# Patient Record
Sex: Male | Born: 1950 | Race: White | Hispanic: No | Smoking: Current every day smoker
Health system: Southern US, Community
[De-identification: ages and names within clinical notes are randomized; demographics above are authoritative.]

## PROBLEM LIST (undated history)

## (undated) DIAGNOSIS — H269 Unspecified cataract: Secondary | ICD-10-CM

## (undated) DIAGNOSIS — I10 Essential (primary) hypertension: Secondary | ICD-10-CM

## (undated) DIAGNOSIS — M1990 Unspecified osteoarthritis, unspecified site: Secondary | ICD-10-CM

## (undated) HISTORY — DX: Essential (primary) hypertension: I10

## (undated) HISTORY — DX: Unspecified osteoarthritis, unspecified site: M19.90

## (undated) HISTORY — DX: Unspecified cataract: H26.9

---

## 2017-10-30 ENCOUNTER — Ambulatory Visit (INDEPENDENT_AMBULATORY_CARE_PROVIDER_SITE_OTHER): Payer: PPO

## 2017-10-30 ENCOUNTER — Ambulatory Visit (INDEPENDENT_AMBULATORY_CARE_PROVIDER_SITE_OTHER): Payer: PPO | Admitting: Orthopaedic Surgery

## 2017-10-30 ENCOUNTER — Encounter (INDEPENDENT_AMBULATORY_CARE_PROVIDER_SITE_OTHER): Payer: Self-pay | Admitting: Orthopaedic Surgery

## 2017-10-30 VITALS — BP 176/102 | HR 79 | Ht 73.0 in | Wt 220.0 lb

## 2017-10-30 DIAGNOSIS — M25561 Pain in right knee: Secondary | ICD-10-CM | POA: Diagnosis not present

## 2017-10-30 MED ORDER — BUPIVACAINE HCL 0.25 % IJ SOLN
4.0000 mL | INTRAMUSCULAR | Status: AC | PRN
  Administered 2017-10-30: 4 mL via INTRA_ARTICULAR

## 2017-10-30 MED ORDER — LIDOCAINE HCL 1 % IJ SOLN
0.5000 mL | INTRAMUSCULAR | Status: AC | PRN
  Administered 2017-10-30: .5 mL

## 2017-10-30 MED ORDER — METHYLPREDNISOLONE ACETATE 40 MG/ML IJ SUSP
40.0000 mg | INTRAMUSCULAR | Status: AC | PRN
  Administered 2017-10-30: 40 mg via INTRA_ARTICULAR

## 2017-10-30 NOTE — Progress Notes (Signed)
Office Visit Note   Patient: Tony Stewart           Date of Birth: 11/12/1950           MRN: [id number] Visit Date: 10/30/2017              Requested by: No referring provider defined for this encounter. PCP: System, Pcp Not In   Assessment & Plan: Visit Diagnoses:  1. Acute pain of right knee     Plan: Knee aspirated yellow fluid without blood slightly cloudy.  We will send it for cell count crystal analysis and cultures.  Recheck 2 weeks.  Intra-articular cortisone injection performed since he has been symptomatic for a month.  Follow-Up Instructions: No Follow-up on file.   Orders:  Orders Placed This Encounter  Procedures  . Large Joint Inj: R knee  . XR KNEE 3 VIEW RIGHT   No orders of the defined types were placed in this encounter.     Procedures: Large Joint Inj: R knee on 10/30/2017 10:20 AM Indications: pain and joint swelling Details: 22 G 1.5 in needle, anterolateral approach  Arthrogram: No  Medications: 40 mg methylPREDNISolone acetate 40 MG/ML; 0.5 mL lidocaine 1 %; 4 mL bupivacaine 0.25 % Outcome: tolerated well, no immediate complications Procedure, treatment alternatives, risks and benefits explained, specific risks discussed. Consent was given by the patient. Immediately prior to procedure a time out was called to verify the correct patient, procedure, equipment, support staff and site/side marked as required. Patient was prepped and draped in the usual sterile fashion.       Clinical Data: No additional findings.   Subjective: Chief Complaint  Patient presents with  . Right Knee - Pain    HPI 67 year old male veteran who has had pain and swelling in his right knee since January.  He had done some hunting and been on his knee does not remember any acute injury.  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  He is occasionally had some flares since that time usually in the spring.  He is tried some  Naprosyn and also some ice with his knee without relief he has had persistent swelling in his knee.  He denies  fevers not noticed any groin pain.  No associated back pain.  He states a week or 2 ago he did have chills for 1 day when he was not feeling real good but this resolved and no problems in the last week.   Review of Systems patient's had no previous surgery.  He is a widower.  He does smoke and drink.  [facility]  Positive for cataracts hypertension and smoking.   Objective: Vital Signs: BP (!) 176/102   Pulse 79   Ht 6\' 1"  (1.854 m)   Wt 220 lb (99.8 kg)   BMI 29.03 kg/m   Physical Exam  Constitutional: He is oriented to person, place, and time. He appears well-developed and well-nourished.  HENT:  Head: Normocephalic and atraumatic.  Eyes: EOM are normal. Pupils are equal, round, and reactive to light.  Neck: No tracheal deviation present. No thyromegaly present.  Cardiovascular: Normal rate.  Pulmonary/Chest: Effort normal. He has no wheezes.  Abdominal: Soft. Bowel sounds are normal.  Neurological: He is alert and oriented to person, place, and time.  Skin: Skin is warm and dry. Capillary refill takes less than 2 seconds.  Psychiatric: He has a normal  mood and affect. His behavior is normal. Judgment and thought content normal.    Ortho Exam patient has intact pedal pulses dorsalis pedis posterior tibial 2+ bilateral.  No lower extremity edema.  There is 3+ knee effusion noted.  Mild crepitus with range of motion.  Some pain with patellar loading.  Has limitation of internal and external rotation right hip of 20 degrees.  Cannot figure 4 on the right.  30 degrees internal rotation and external rotation opposite left hip.  Right hip range of motion does not reproduce his knee pain.  Anterior tib posterior tib is palpable.  He ambulates with a slight knee flexed gait with knee limp on the right.  No sciatic notch tenderness negative  popliteal compression test.  Specialty Comments:  No specialty comments available.  Imaging: Xr Knee 3 View Right  Result Date: 10/30/2017 Three-view x-rays right knee obtained including standing bilateral knees and bilateral sunrise patella.  This shows no acute changes.  Mild lateral spurring and mild joint narrowing.  Increased fluid noted on the right knee. Impression: Mild knee joint space narrowing with small osteophytes.  No acute changes.  There is some calcification in the right femoral artery    PMFS History: There are no active problems to display for this patient.  Past Medical History:  Diagnosis Date  . Arthritis   . Cataracts, bilateral   . Hypertension     Family History  Problem Relation Age of Onset  . Cancer Mother     History reviewed. No pertinent surgical history. Social History   Occupational History  . Not on file  Tobacco Use  . Smoking status: Current Every Day Smoker  . Smokeless tobacco: Never Used  Substance and Sexual Activity  . Alcohol use: Yes    Frequency: Never  . Drug use: No  . Sexual activity: Not on file

## 2017-10-30 NOTE — Addendum Note (Signed)
Addended by: Rogers SeedsYEATTS, Jaquae Rieves M on: 10/30/2017 10:59 AM   Modules accepted: Orders

## 2017-10-31 LAB — SYNOVIAL CELL COUNT + DIFF, W/ CRYSTALS
BASOPHILS, %: 0 %
Eosinophils-Synovial: 0 % (ref 0–2)
LYMPHOCYTES-SYNOVIAL FLD: 13 % (ref 0–74)
Monocyte/Macrophage: 2 % (ref 0–69)
NEUTROPHIL, SYNOVIAL: 85 % — AB (ref 0–24)
Synoviocytes, %: 0 % (ref 0–15)
WBC, SYNOVIAL: 7670 {cells}/uL — AB (ref <150)

## 2017-10-31 LAB — TIQ-NTM

## 2017-11-01 ENCOUNTER — Telehealth (INDEPENDENT_AMBULATORY_CARE_PROVIDER_SITE_OTHER): Payer: Self-pay | Admitting: Orthopaedic Surgery

## 2017-11-01 NOTE — Telephone Encounter (Signed)
Patient is trying to return your call.

## 2017-11-01 NOTE — Telephone Encounter (Signed)
Patients girlfriend called and said they received a phone call from the office, she thinks its concerning his lab work. Please advise # [phone]

## 2017-11-01 NOTE — Telephone Encounter (Signed)
I called discussed.  

## 2017-11-05 LAB — ANAEROBIC AND AEROBIC CULTURE
AER RESULT:: NO GROWTH
MICRO NUMBER: 90214094
MICRO NUMBER:: 90214093
SPECIMEN QUALITY: ADEQUATE
SPECIMEN QUALITY:: ADEQUATE

## 2017-11-14 ENCOUNTER — Encounter (INDEPENDENT_AMBULATORY_CARE_PROVIDER_SITE_OTHER): Payer: Self-pay | Admitting: Orthopaedic Surgery

## 2017-11-14 ENCOUNTER — Ambulatory Visit (INDEPENDENT_AMBULATORY_CARE_PROVIDER_SITE_OTHER): Payer: PPO | Admitting: Orthopaedic Surgery

## 2017-11-14 VITALS — BP 163/96 | HR 68

## 2017-11-14 DIAGNOSIS — M7542 Impingement syndrome of left shoulder: Secondary | ICD-10-CM | POA: Diagnosis not present

## 2017-11-14 DIAGNOSIS — M659 Synovitis and tenosynovitis, unspecified: Secondary | ICD-10-CM

## 2017-11-14 NOTE — Progress Notes (Signed)
Office Visit Note   Patient: Tony Stewart           Date of Birth: 07/20/1951           MRN: [id number] Visit Date: 11/14/2017              Requested by: No referring provider defined for this encounter. PCP: System, Pcp Not In   Assessment & Plan: Visit Diagnoses:  1. Impingement syndrome of left shoulder   2. Synovitis of right knee     Plan:  Right Knee is significantly better.  He develops any locking catching or recurrent swelling he will let us know we can consider MRI scan of his knee.  His left shoulder shows some mild impingement not significant enough to consider injection or therapy referral.  He will call us if he gets worse.  Follow-Up Instructions: Return if symptoms worsen or fail to improve.   Orders:  No orders of the defined types were placed in this encounter.  No orders of the defined types were placed in this encounter.     Procedures: No procedures performed   Clinical Data: No additional findings.   Subjective: Chief Complaint  Patient presents with  . Right Knee - Follow-up, Pain    HPI 67 year old male returns after right  knee aspiration and injection on 10/30/2017.  Fluid analysis was consistent with some synovitis no crystals were seen.  Cultures were negative.  He still has a little bit of posterior medial tenderness but is ambulating much better and has not had any recurrence of swelling.  He has had some problems with his left shoulder with outstretched reaching and overhead activity.  Review of Systems updated and unchanged from last office visit.  Chills or fever no recurrence of swelling.  He has had new problems with left shoulder discomfort.   Objective: Vital Signs: BP (!) 163/96   Pulse 68   Physical Exam  Constitutional: He is oriented to person, place, and time. He appears well-developed and well-nourished.  HENT:  Head: Normocephalic and atraumatic.  Eyes: EOM are normal. Pupils are equal, round, and reactive to light.    Neck: No tracheal deviation present. No thyromegaly present.  Cardiovascular: Normal rate.  Pulmonary/Chest: Effort normal. He has no wheezes.  Abdominal: Soft. Bowel sounds are normal.  Neurological: He is alert and oriented to person, place, and time.  Skin: Skin is warm and dry. Capillary refill takes less than 2 seconds.  Psychiatric: He has a normal mood and affect. His behavior is normal. Judgment and thought content normal.    Ortho Exam mild right knee posterior medial tenderness.  Negative pain with hyperextension ACL PCL exam is normal collateral ligament.  Left shoulder shows some discomfort with impingement he takes good supraspinatus strength.  No instability of the shoulder.  Internal and external rotation is strong lying long head of the biceps shows minimal tenderness.  Specialty Comments:  No specialty comments available.  Imaging: No results found.   PMFS History: Patient Active Problem List   Diagnosis Date Noted  . Impingement syndrome of left shoulder 11/14/2017  . Synovitis of right knee 11/14/2017   Past Medical History:  Diagnosis Date  . Arthritis   . Cataracts, bilateral   . Hypertension     Family History  Problem Relation Age of Onset  . Cancer Mother     No past surgical history on file. Social History   Occupational History  . Not on file  Tobacco Use  .  Smoking status: Current Every Day Smoker  . Smokeless tobacco: Never Used  Substance and Sexual Activity  . Alcohol use: Yes    Frequency: Never  . Drug use: No  . Sexual activity: Not on file

## 2018-03-29 DIAGNOSIS — Z6830 Body mass index (BMI) 30.0-30.9, adult: Secondary | ICD-10-CM | POA: Diagnosis not present

## 2018-03-29 DIAGNOSIS — M353 Polymyalgia rheumatica: Secondary | ICD-10-CM | POA: Diagnosis not present

## 2018-03-29 DIAGNOSIS — I10 Essential (primary) hypertension: Secondary | ICD-10-CM | POA: Diagnosis not present

## 2018-03-29 DIAGNOSIS — E559 Vitamin D deficiency, unspecified: Secondary | ICD-10-CM | POA: Diagnosis not present

## 2018-04-26 DIAGNOSIS — Z0000 Encounter for general adult medical examination without abnormal findings: Secondary | ICD-10-CM | POA: Diagnosis not present

## 2018-04-26 DIAGNOSIS — E782 Mixed hyperlipidemia: Secondary | ICD-10-CM | POA: Diagnosis not present

## 2018-04-26 DIAGNOSIS — I10 Essential (primary) hypertension: Secondary | ICD-10-CM | POA: Diagnosis not present

## 2018-04-26 DIAGNOSIS — M353 Polymyalgia rheumatica: Secondary | ICD-10-CM | POA: Diagnosis not present

## 2018-04-26 DIAGNOSIS — E559 Vitamin D deficiency, unspecified: Secondary | ICD-10-CM | POA: Diagnosis not present

## 2018-04-26 DIAGNOSIS — Z6830 Body mass index (BMI) 30.0-30.9, adult: Secondary | ICD-10-CM | POA: Diagnosis not present

## 2019-04-12 DIAGNOSIS — H2513 Age-related nuclear cataract, bilateral: Secondary | ICD-10-CM | POA: Diagnosis not present

## 2019-04-12 DIAGNOSIS — H47012 Ischemic optic neuropathy, left eye: Secondary | ICD-10-CM | POA: Diagnosis not present

## 2019-04-12 DIAGNOSIS — H35013 Changes in retinal vascular appearance, bilateral: Secondary | ICD-10-CM | POA: Diagnosis not present

## 2019-04-12 DIAGNOSIS — H4323 Crystalline deposits in vitreous body, bilateral: Secondary | ICD-10-CM | POA: Diagnosis not present

## 2019-04-15 ENCOUNTER — Other Ambulatory Visit: Payer: Self-pay

## 2019-04-18 DIAGNOSIS — H35013 Changes in retinal vascular appearance, bilateral: Secondary | ICD-10-CM | POA: Diagnosis not present

## 2019-04-18 DIAGNOSIS — H47012 Ischemic optic neuropathy, left eye: Secondary | ICD-10-CM | POA: Diagnosis not present

## 2019-04-25 DIAGNOSIS — M353 Polymyalgia rheumatica: Secondary | ICD-10-CM | POA: Diagnosis not present

## 2019-04-25 DIAGNOSIS — E782 Mixed hyperlipidemia: Secondary | ICD-10-CM | POA: Diagnosis not present

## 2019-04-25 DIAGNOSIS — I10 Essential (primary) hypertension: Secondary | ICD-10-CM | POA: Diagnosis not present

## 2019-04-25 DIAGNOSIS — Z6830 Body mass index (BMI) 30.0-30.9, adult: Secondary | ICD-10-CM | POA: Diagnosis not present

## 2019-04-25 DIAGNOSIS — E559 Vitamin D deficiency, unspecified: Secondary | ICD-10-CM | POA: Diagnosis not present

## 2019-04-25 DIAGNOSIS — H47012 Ischemic optic neuropathy, left eye: Secondary | ICD-10-CM | POA: Diagnosis not present

## 2019-04-25 DIAGNOSIS — Z0000 Encounter for general adult medical examination without abnormal findings: Secondary | ICD-10-CM | POA: Diagnosis not present

## 2019-05-01 DIAGNOSIS — Z0001 Encounter for general adult medical examination with abnormal findings: Secondary | ICD-10-CM | POA: Diagnosis not present

## 2019-05-01 DIAGNOSIS — E782 Mixed hyperlipidemia: Secondary | ICD-10-CM | POA: Diagnosis not present

## 2019-05-01 DIAGNOSIS — M353 Polymyalgia rheumatica: Secondary | ICD-10-CM | POA: Diagnosis not present

## 2019-05-01 DIAGNOSIS — E559 Vitamin D deficiency, unspecified: Secondary | ICD-10-CM | POA: Diagnosis not present

## 2019-05-01 DIAGNOSIS — I10 Essential (primary) hypertension: Secondary | ICD-10-CM | POA: Diagnosis not present

## 2019-05-06 DIAGNOSIS — Z0000 Encounter for general adult medical examination without abnormal findings: Secondary | ICD-10-CM | POA: Diagnosis not present

## 2019-06-18 DIAGNOSIS — H35013 Changes in retinal vascular appearance, bilateral: Secondary | ICD-10-CM | POA: Diagnosis not present

## 2019-06-18 DIAGNOSIS — H47012 Ischemic optic neuropathy, left eye: Secondary | ICD-10-CM | POA: Diagnosis not present

## 2019-08-07 ENCOUNTER — Other Ambulatory Visit: Payer: Self-pay

## 2019-11-20 DIAGNOSIS — M353 Polymyalgia rheumatica: Secondary | ICD-10-CM | POA: Diagnosis not present

## 2019-11-20 DIAGNOSIS — Z0000 Encounter for general adult medical examination without abnormal findings: Secondary | ICD-10-CM | POA: Diagnosis not present

## 2019-11-20 DIAGNOSIS — I10 Essential (primary) hypertension: Secondary | ICD-10-CM | POA: Diagnosis not present

## 2019-11-20 DIAGNOSIS — E559 Vitamin D deficiency, unspecified: Secondary | ICD-10-CM | POA: Diagnosis not present

## 2019-11-20 DIAGNOSIS — Z6830 Body mass index (BMI) 30.0-30.9, adult: Secondary | ICD-10-CM | POA: Diagnosis not present

## 2019-11-20 DIAGNOSIS — E782 Mixed hyperlipidemia: Secondary | ICD-10-CM | POA: Diagnosis not present

## 2019-11-20 DIAGNOSIS — R7301 Impaired fasting glucose: Secondary | ICD-10-CM | POA: Diagnosis not present

## 2019-12-02 DIAGNOSIS — I10 Essential (primary) hypertension: Secondary | ICD-10-CM | POA: Diagnosis not present

## 2019-12-02 DIAGNOSIS — E559 Vitamin D deficiency, unspecified: Secondary | ICD-10-CM | POA: Diagnosis not present

## 2019-12-02 DIAGNOSIS — F17210 Nicotine dependence, cigarettes, uncomplicated: Secondary | ICD-10-CM | POA: Diagnosis not present

## 2019-12-02 DIAGNOSIS — Z0000 Encounter for general adult medical examination without abnormal findings: Secondary | ICD-10-CM | POA: Diagnosis not present

## 2019-12-02 DIAGNOSIS — E782 Mixed hyperlipidemia: Secondary | ICD-10-CM | POA: Diagnosis not present

## 2019-12-02 DIAGNOSIS — M353 Polymyalgia rheumatica: Secondary | ICD-10-CM | POA: Diagnosis not present

## 2020-01-02 ENCOUNTER — Ambulatory Visit (INDEPENDENT_AMBULATORY_CARE_PROVIDER_SITE_OTHER): Payer: PPO | Admitting: Orthopaedic Surgery

## 2020-01-02 ENCOUNTER — Ambulatory Visit (INDEPENDENT_AMBULATORY_CARE_PROVIDER_SITE_OTHER): Payer: PPO

## 2020-01-02 ENCOUNTER — Encounter: Payer: Self-pay | Admitting: Orthopaedic Surgery

## 2020-01-02 ENCOUNTER — Other Ambulatory Visit: Payer: Self-pay

## 2020-01-02 VITALS — BP 168/101 | HR 71 | Ht 73.0 in | Wt 230.0 lb

## 2020-01-02 DIAGNOSIS — M79671 Pain in right foot: Secondary | ICD-10-CM | POA: Diagnosis not present

## 2020-01-02 NOTE — Progress Notes (Signed)
Office Visit Note   Patient: Tony Stewart           Date of Birth: 06/05/1951           MRN: [id number] Visit Date: 01/02/2020              Requested by: No referring provider defined for this encounter. PCP: System, Pcp Not In   Assessment & Plan: Visit Diagnoses:  1. Pain in right foot     Plan: 10 blade scalpel used for callus debridement.  We discussed foot care filing down the hard callus.  He understands it will take some time to get it fully debrided but considerable amount of thick callus was resected with the scalpel blade.  Afterwards he could walk states he had minimal discomfort.  Follow-Up Instructions: No follow-ups on file.   Orders:  Orders Placed This Encounter  Procedures  . XR Foot Complete Right   No orders of the defined types were placed in this encounter.     Procedures: No procedures performed   Clinical Data: No additional findings.   Subjective: Chief Complaint  Patient presents with  . Right Foot - Pain    HPI 69 year old male seen with right lateral foot pain.  He has had some filing done plantar surface of the fifth metatarsal with persistent pain no drainage.  He likes to [place] hunt had been doing a lot of walking because is limited to male [place] and states now is having problems walking.  Denies fever chills no past history of foot fractures.  Review of Systems 14 point review of systems noncontributory.  Previously arthroscopy doing well.   Objective: Vital Signs: BP (!) 168/101   Pulse 71   Ht 6\' 1"  (1.854 m)   Wt 230 lb (104.3 kg)   BMI 30.34 kg/m   Physical Exam Constitutional:      Appearance: He is well-developed.  HENT:     Head: Normocephalic and atraumatic.  Eyes:     Pupils: Pupils are equal, round, and reactive to light.  Neck:     Thyroid: No thyromegaly.     Trachea: No tracheal deviation.  Cardiovascular:     Rate and Rhythm: Normal rate.  Pulmonary:     Effort: Pulmonary effort is normal.   Breath sounds: No wheezing.  Abdominal:     General: Bowel sounds are normal.     Palpations: Abdomen is soft.  Skin:    General: Skin is warm and dry.     Capillary Refill: Capillary refill takes less than 2 seconds.  Neurological:     Mental Status: He is alert and oriented to person, place, and time.  Psychiatric:        Behavior: Behavior normal.        Thought Content: Thought content normal.        Judgment: Judgment normal.     Ortho Exam patient has hard thickened callus over the fifth metatarsal head tender.  No underlying blister.  Midfoot subtalar ankle range of motion is normal.  Specialty Comments:  No specialty comments available.  Imaging: No results found.   PMFS History: Patient Active Problem List   Diagnosis Date Noted  . Impingement syndrome of left shoulder 11/14/2017  . Synovitis of right knee 11/14/2017   Past Medical History:  Diagnosis Date  . Arthritis   . Cataracts, bilateral   . Hypertension     Family History  Problem Relation Age of Onset  . Cancer Mother  No past surgical history on file. Social History   Occupational History  . Not on file  Tobacco Use  . Smoking status: Current Every Day Smoker  . Smokeless tobacco: Never Used  Substance and Sexual Activity  . Alcohol use: Yes  . Drug use: No  . Sexual activity: Not on file

## 2020-06-24 DIAGNOSIS — Z0000 Encounter for general adult medical examination without abnormal findings: Secondary | ICD-10-CM | POA: Diagnosis not present

## 2020-06-24 DIAGNOSIS — E559 Vitamin D deficiency, unspecified: Secondary | ICD-10-CM | POA: Diagnosis not present

## 2020-06-24 DIAGNOSIS — E782 Mixed hyperlipidemia: Secondary | ICD-10-CM | POA: Diagnosis not present

## 2020-06-24 DIAGNOSIS — Z6830 Body mass index (BMI) 30.0-30.9, adult: Secondary | ICD-10-CM | POA: Diagnosis not present

## 2020-06-24 DIAGNOSIS — M353 Polymyalgia rheumatica: Secondary | ICD-10-CM | POA: Diagnosis not present

## 2020-06-24 DIAGNOSIS — I10 Essential (primary) hypertension: Secondary | ICD-10-CM | POA: Diagnosis not present

## 2020-07-08 DIAGNOSIS — D751 Secondary polycythemia: Secondary | ICD-10-CM | POA: Diagnosis not present

## 2020-07-08 DIAGNOSIS — R7301 Impaired fasting glucose: Secondary | ICD-10-CM | POA: Diagnosis not present

## 2020-07-08 DIAGNOSIS — E782 Mixed hyperlipidemia: Secondary | ICD-10-CM | POA: Diagnosis not present

## 2020-07-08 DIAGNOSIS — E559 Vitamin D deficiency, unspecified: Secondary | ICD-10-CM | POA: Diagnosis not present

## 2020-07-08 DIAGNOSIS — M353 Polymyalgia rheumatica: Secondary | ICD-10-CM | POA: Diagnosis not present

## 2020-07-08 DIAGNOSIS — I10 Essential (primary) hypertension: Secondary | ICD-10-CM | POA: Diagnosis not present

## 2020-07-08 DIAGNOSIS — F17210 Nicotine dependence, cigarettes, uncomplicated: Secondary | ICD-10-CM | POA: Diagnosis not present

## 2021-01-06 DIAGNOSIS — I10 Essential (primary) hypertension: Secondary | ICD-10-CM | POA: Diagnosis not present

## 2021-03-03 DIAGNOSIS — E559 Vitamin D deficiency, unspecified: Secondary | ICD-10-CM | POA: Diagnosis not present

## 2021-03-03 DIAGNOSIS — Z125 Encounter for screening for malignant neoplasm of prostate: Secondary | ICD-10-CM | POA: Diagnosis not present

## 2021-03-03 DIAGNOSIS — R7301 Impaired fasting glucose: Secondary | ICD-10-CM | POA: Diagnosis not present

## 2021-03-03 DIAGNOSIS — E782 Mixed hyperlipidemia: Secondary | ICD-10-CM | POA: Diagnosis not present

## 2021-03-03 DIAGNOSIS — I10 Essential (primary) hypertension: Secondary | ICD-10-CM | POA: Diagnosis not present

## 2021-03-10 DIAGNOSIS — I10 Essential (primary) hypertension: Secondary | ICD-10-CM | POA: Diagnosis not present

## 2021-03-10 DIAGNOSIS — E559 Vitamin D deficiency, unspecified: Secondary | ICD-10-CM | POA: Diagnosis not present

## 2021-03-10 DIAGNOSIS — R7301 Impaired fasting glucose: Secondary | ICD-10-CM | POA: Diagnosis not present

## 2021-03-10 DIAGNOSIS — E782 Mixed hyperlipidemia: Secondary | ICD-10-CM | POA: Diagnosis not present

## 2021-09-16 DIAGNOSIS — U071 COVID-19: Secondary | ICD-10-CM | POA: Diagnosis not present

## 2021-12-04 ENCOUNTER — Emergency Department (HOSPITAL_COMMUNITY): Payer: PPO

## 2021-12-04 ENCOUNTER — Other Ambulatory Visit: Payer: Self-pay

## 2021-12-04 ENCOUNTER — Emergency Department (HOSPITAL_COMMUNITY)
Admission: EM | Admit: 2021-12-04 | Discharge: 2021-12-04 | Disposition: A | Discharge status: Home / Self Care | Payer: PPO | Attending: Emergency Medicine | Admitting: Emergency Medicine

## 2021-12-04 ENCOUNTER — Encounter (HOSPITAL_COMMUNITY): Payer: Self-pay

## 2021-12-04 DIAGNOSIS — R270 Ataxia, unspecified: Secondary | ICD-10-CM

## 2021-12-04 DIAGNOSIS — R42 Dizziness and giddiness: Secondary | ICD-10-CM | POA: Insufficient documentation

## 2021-12-04 DIAGNOSIS — I10 Essential (primary) hypertension: Secondary | ICD-10-CM | POA: Diagnosis not present

## 2021-12-04 DIAGNOSIS — R2689 Other abnormalities of gait and mobility: Secondary | ICD-10-CM | POA: Insufficient documentation

## 2021-12-04 DIAGNOSIS — R202 Paresthesia of skin: Secondary | ICD-10-CM | POA: Diagnosis not present

## 2021-12-04 DIAGNOSIS — R079 Chest pain, unspecified: Secondary | ICD-10-CM | POA: Diagnosis not present

## 2021-12-04 DIAGNOSIS — I6782 Cerebral ischemia: Secondary | ICD-10-CM | POA: Diagnosis not present

## 2021-12-04 DIAGNOSIS — M47812 Spondylosis without myelopathy or radiculopathy, cervical region: Secondary | ICD-10-CM | POA: Diagnosis not present

## 2021-12-04 DIAGNOSIS — I700 Atherosclerosis of aorta: Secondary | ICD-10-CM | POA: Diagnosis not present

## 2021-12-04 DIAGNOSIS — M79603 Pain in arm, unspecified: Secondary | ICD-10-CM | POA: Diagnosis not present

## 2021-12-04 DIAGNOSIS — M5033 Other cervical disc degeneration, cervicothoracic region: Secondary | ICD-10-CM | POA: Diagnosis not present

## 2021-12-04 LAB — CBC
HCT: 50.8 % (ref 39.0–52.0)
Hemoglobin: 17 g/dL (ref 13.0–17.0)
MCH: 33.6 pg (ref 26.0–34.0)
MCHC: 33.5 g/dL (ref 30.0–36.0)
MCV: 100.4 fL — ABNORMAL HIGH (ref 80.0–100.0)
Platelets: 287 10*3/uL (ref 150–400)
RBC: 5.06 MIL/uL (ref 4.22–5.81)
RDW: 14 % (ref 11.5–15.5)
WBC: 7.5 10*3/uL (ref 4.0–10.5)
nRBC: 0 % (ref 0.0–0.2)

## 2021-12-04 LAB — BASIC METABOLIC PANEL
Anion gap: 8 (ref 5–15)
BUN: 13 mg/dL (ref 8–23)
CO2: 25 mmol/L (ref 22–32)
Calcium: 9.3 mg/dL (ref 8.9–10.3)
Chloride: 106 mmol/L (ref 98–111)
Creatinine, Ser: 1.07 mg/dL (ref 0.61–1.24)
GFR, Estimated: >60 mL/min (ref >60)
Glucose, Bld: 110 mg/dL — ABNORMAL HIGH (ref 70–99)
Potassium: 4.1 mmol/L (ref 3.5–5.1)
Sodium: 139 mmol/L (ref 135–145)

## 2021-12-04 LAB — HEPATIC FUNCTION PANEL
ALT: 20 U/L (ref 0–44)
AST: 22 U/L (ref 15–41)
Albumin: 4.2 g/dL (ref 3.5–5.0)
Alkaline Phosphatase: 42 U/L (ref 38–126)
Bilirubin, Direct: 0.1 mg/dL (ref 0.0–0.2)
Indirect Bilirubin: 0.9 mg/dL (ref 0.3–0.9)
Total Bilirubin: 1 mg/dL (ref 0.3–1.2)
Total Protein: 7.3 g/dL (ref 6.5–8.1)

## 2021-12-04 LAB — TROPONIN I (HIGH SENSITIVITY)
Troponin I (High Sensitivity): 5 ng/L (ref <18)
Troponin I (High Sensitivity): 6 ng/L (ref <18)

## 2021-12-04 NOTE — ED Provider Notes (Signed)
?[facility] ?Provider Note ? ? ?CSN: [id number] ?Arrival date & time: 12/04/21  1119 ? ?  ? ?History ? ?Chief Complaint  ?Patient presents with  ? Chest Pain  ? ? ?Tony Stewart is a 71 y.o. male. ? ?Patient complains of feeling unsteady walking for a number of weeks and also some numbness in his left arm with some unsteadiness.  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 ? ?The history is provided by the patient and medical records.  ?Weakness ?Severity:  Moderate ?Onset quality:  Sudden ?Timing:  Constant ?Progression:  Waxing and waning ?Chronicity:  New ?Context: alcohol use   ?Relieved by:  Nothing ?Worsened by:  Nothing ?Ineffective treatments:  None tried ?Associated symptoms: dizziness   ?Associated symptoms: no abdominal pain, no chest pain, no cough, no diarrhea, no frequency, no headaches and no seizures   ? ?  ? ?Home Medications ?Prior to Admission medications   ?Medication Sig Start Date End Date Taking? Authorizing Provider  ?amLODipine (NORVASC) 10 MG tablet Take 10 mg by mouth daily.    [provider]  ?losartan (COZAAR) 50 MG tablet Take 50 mg by mouth daily. 12/02/19   [provider]  ?   ? ?Allergies    ?Patient has no known allergies.   ? ?Review of Systems   ?Review of Systems  ?Constitutional:  Negative for appetite change and fatigue.  ?HENT:  Negative for congestion, ear discharge and sinus pressure.   ?Eyes:  Negative for discharge.  ?Respiratory:  Negative for cough.   ?Cardiovascular:  Negative for chest pain.  ?Gastrointestinal:  Negative for abdominal pain and diarrhea.  ?Genitourinary:  Negative for frequency and hematuria.  ?Musculoskeletal:  Negative for back pain.  ?Skin:  Negative for rash.  ?Neurological:  Positive for dizziness and weakness. Negative for seizures and headaches.  ?     Dizziness and ataxia and poor coordination in his arms  ?Psychiatric/Behavioral:  Negative for hallucinations.   ? ?Physical  Exam ?Updated Vital Signs ?BP 133/89   Pulse 78   Temp 98.2 ?F (36.8 ?C)   Resp 18   Ht 6\' 1"  (1.854 m)   Wt 104.3 kg   SpO2 95%   BMI 30.34 kg/m?  ?Physical Exam ?Vitals and nursing note reviewed.  ?Constitutional:   ?   Appearance: He is well-developed.  ?HENT:  ?   Head: Normocephalic.  ?   Nose: Nose normal.  ?Eyes:  ?   General: No scleral icterus. ?   Conjunctiva/sclera: Conjunctivae normal.  ?Neck:  ?   Thyroid: No thyromegaly.  ?Cardiovascular:  ?   Rate and Rhythm: Normal rate and regular rhythm.  ?   Heart sounds: No murmur heard. ?  No friction rub. No gallop.  ?Pulmonary:  ?   Breath sounds: No stridor. No wheezing or rales.  ?Chest:  ?   Chest wall: No tenderness.  ?Abdominal:  ?   General: There is no distension.  ?   Tenderness: There is no abdominal tenderness. There is no rebound.  ?Musculoskeletal:     ?   General: Normal range of motion.  ?   Cervical back: Neck supple.  ?   Comments: Patient with minor ataxia and minor coordination problems with his arm  ?Lymphadenopathy:  ?   Cervical: No cervical adenopathy.  ?Skin: ?   Findings: No erythema or rash.  ?Neurological:  ?   Mental Status: He is alert and oriented to person, place,  and time.  ?   Motor: No abnormal muscle tone.  ?   Coordination: Coordination normal.  ?Psychiatric:     ?   Behavior: Behavior normal.  ? ? ?ED Results / Procedures / Treatments   ?Labs ?(all labs ordered are listed, but only abnormal results are displayed) ?Labs Reviewed  ?BASIC METABOLIC PANEL - Abnormal; Notable for the following components:  ?    Result Value  ? Glucose, Bld 110 (*)   ? All other components within normal limits  ?CBC - Abnormal; Notable for the following components:  ? MCV 100.4 (*)   ? All other components within normal limits  ?HEPATIC FUNCTION PANEL  ?TROPONIN I (HIGH SENSITIVITY)  ?TROPONIN I (HIGH SENSITIVITY)  ? ? ?EKG ?None ? ?Radiology ?CT Head Wo Contrast ? ?Result Date: 12/04/2021 ?CLINICAL DATA:  71 year old male with history of  persistent recurrent dizziness. EXAM: CT HEAD WITHOUT CONTRAST CT CERVICAL SPINE WITHOUT CONTRAST TECHNIQUE: Multidetector CT imaging of the head and cervical spine was performed following the standard protocol without intravenous contrast. Multiplanar CT image reconstructions of the cervical spine were also generated. RADIATION DOSE REDUCTION: This exam was performed according to the departmental dose-optimization program which includes automated exposure control, adjustment of the mA and/or kV according to patient size and/or use of iterative reconstruction technique. COMPARISON:  No priors. FINDINGS: CT HEAD FINDINGS Brain: Patchy areas of mild decreased attenuation are noted throughout the deep and periventricular white matter of the cerebral hemispheres bilaterally, compatible with mild chronic microvascular ischemic disease. No evidence of acute infarction, hemorrhage, hydrocephalus, extra-axial collection or mass lesion/mass effect. Vascular: No hyperdense vessel or unexpected calcification. Skull: Normal. Negative for fracture or focal lesion. Sinuses/Orbits: No acute finding. Other: None. CT CERVICAL SPINE FINDINGS Alignment: Normal. Skull base and vertebrae: No acute fracture. No primary bone lesion or focal pathologic process. Soft tissues and spinal canal: No prevertebral fluid or swelling. No visible canal hematoma. Disc levels: Minimal multilevel degenerative disc disease, most evident at C6-C7 and C7-T1. Mild multilevel facet arthropathy. Upper chest: Negative. Other: None. IMPRESSION: 1. No acute intracranial abnormalities. 2. No acute abnormality of the cervical spine. 3. Mild chronic microvascular ischemic changes in the cerebral white matter, as above. 4. Mild multilevel degenerative disc disease and cervical spondylosis, as above. Electronically Signed   By: Vinnie Langton M.D.   On: 12/04/2021 12:44  ? ?CT Cervical Spine Wo Contrast ? ?Result Date: 12/04/2021 ?CLINICAL DATA:  71 year old male  with history of persistent recurrent dizziness. EXAM: CT HEAD WITHOUT CONTRAST CT CERVICAL SPINE WITHOUT CONTRAST TECHNIQUE: Multidetector CT imaging of the head and cervical spine was performed following the standard protocol without intravenous contrast. Multiplanar CT image reconstructions of the cervical spine were also generated. RADIATION DOSE REDUCTION: This exam was performed according to the departmental dose-optimization program which includes automated exposure control, adjustment of the mA and/or kV according to patient size and/or use of iterative reconstruction technique. COMPARISON:  No priors. FINDINGS: CT HEAD FINDINGS Brain: Patchy areas of mild decreased attenuation are noted throughout the deep and periventricular white matter of the cerebral hemispheres bilaterally, compatible with mild chronic microvascular ischemic disease. No evidence of acute infarction, hemorrhage, hydrocephalus, extra-axial collection or mass lesion/mass effect. Vascular: No hyperdense vessel or unexpected calcification. Skull: Normal. Negative for fracture or focal lesion. Sinuses/Orbits: No acute finding. Other: None. CT CERVICAL SPINE FINDINGS Alignment: Normal. Skull base and vertebrae: No acute fracture. No primary bone lesion or focal pathologic process. Soft tissues and spinal canal: No prevertebral  fluid or swelling. No visible canal hematoma. Disc levels: Minimal multilevel degenerative disc disease, most evident at C6-C7 and C7-T1. Mild multilevel facet arthropathy. Upper chest: Negative. Other: None. IMPRESSION: 1. No acute intracranial abnormalities. 2. No acute abnormality of the cervical spine. 3. Mild chronic microvascular ischemic changes in the cerebral white matter, as above. 4. Mild multilevel degenerative disc disease and cervical spondylosis, as above. Electronically Signed   By: Vinnie Langton M.D.   On: 12/04/2021 12:44  ? ?DG Chest Portable 1 View ? ?Result Date: 12/04/2021 ?CLINICAL DATA:   71 year old male with history of chest pain for the past 2 weeks. EXAM: PORTABLE CHEST 1 VIEW COMPARISON:  No priors. FINDINGS: Lung volumes are normal. No consolidative airspace disease. No pleural effusions. No pneumot

## 2021-12-04 NOTE — ED Notes (Signed)
Patient refusing admission and wanting to be discharged and reports he will follow up.  Patient advised of risk of leaving such as heart attack stroke, death.  Verbalized understanding.  ?

## 2021-12-04 NOTE — ED Notes (Addendum)
Patient also reports being dizzy. States his coordination is off and left side is having numbness.  Reports he feels as though he is going to fall when he walks.  ?

## 2021-12-04 NOTE — ED Notes (Signed)
Patients significant other reports patient drinks 6-12 cans of beer a day along with 1-2 packs a day of cigarettes.  ?

## 2021-12-04 NOTE — Discharge Instructions (Signed)
Drink plenty of fluids and be careful ambulating.  You can get a cane to help with walking.  Follow-up with your family doctor this week and also follow-up with Dr. Gerilyn Pilgrim in 1 to 2 weeks. ?

## 2021-12-04 NOTE — ED Triage Notes (Signed)
Pt bib ems for cp x 2 weeks intermittently.  Has been feeling "Off."  Reports lack of coordination x 2 weeks.  Reports covid in Jan.  Hx of htn.  Non compliant with home medication.  1 nitro and 1 asa given by ems.  Rates pain at present 6/10.  20ga in [place].   ?

## 2021-12-06 ENCOUNTER — Encounter (HOSPITAL_COMMUNITY): Payer: Self-pay

## 2021-12-06 DIAGNOSIS — G459 Transient cerebral ischemic attack, unspecified: Secondary | ICD-10-CM | POA: Diagnosis not present

## 2021-12-06 DIAGNOSIS — E669 Obesity, unspecified: Secondary | ICD-10-CM | POA: Diagnosis not present

## 2021-12-06 DIAGNOSIS — I10 Essential (primary) hypertension: Secondary | ICD-10-CM | POA: Diagnosis not present

## 2021-12-06 DIAGNOSIS — F17200 Nicotine dependence, unspecified, uncomplicated: Secondary | ICD-10-CM | POA: Diagnosis not present

## 2021-12-06 DIAGNOSIS — Z6830 Body mass index (BMI) 30.0-30.9, adult: Secondary | ICD-10-CM | POA: Diagnosis not present

## 2021-12-14 DIAGNOSIS — G459 Transient cerebral ischemic attack, unspecified: Secondary | ICD-10-CM | POA: Diagnosis not present

## 2021-12-14 DIAGNOSIS — I10 Essential (primary) hypertension: Secondary | ICD-10-CM | POA: Diagnosis not present

## 2021-12-14 DIAGNOSIS — F17200 Nicotine dependence, unspecified, uncomplicated: Secondary | ICD-10-CM | POA: Diagnosis not present

## 2021-12-31 ENCOUNTER — Ambulatory Visit (INDEPENDENT_AMBULATORY_CARE_PROVIDER_SITE_OTHER): Payer: PPO

## 2021-12-31 ENCOUNTER — Ambulatory Visit: Payer: PPO | Admitting: Podiatry

## 2021-12-31 DIAGNOSIS — L84 Corns and callosities: Secondary | ICD-10-CM

## 2021-12-31 DIAGNOSIS — M21611 Bunion of right foot: Secondary | ICD-10-CM | POA: Diagnosis not present

## 2021-12-31 DIAGNOSIS — M21619 Bunion of unspecified foot: Secondary | ICD-10-CM

## 2021-12-31 DIAGNOSIS — M7751 Other enthesopathy of right foot: Secondary | ICD-10-CM | POA: Diagnosis not present

## 2021-12-31 MED ORDER — TRIAMCINOLONE ACETONIDE 10 MG/ML IJ SUSP
10.0000 mg | Freq: Once | INTRAMUSCULAR | Status: AC
  Administered 2021-12-31: 10 mg

## 2022-01-03 NOTE — Progress Notes (Signed)
Subjective:  ? ?Patient ID: Tony Stewart, male   DOB: 71 y.o.   MRN: [id number]  ? ?HPI ?Patient presents with significant pain inflammation of the plantar right foot with lesion formation x2 with fluid around the fifth metatarsal head lesion around the third metatarsal.  States its been sore making it hard to walk on and its been an ongoing problem for a while and patient does not smoke likes to be active ? ? ?Review of Systems  ?All other systems reviewed and are negative. ? ? ?   ?Objective:  ?Physical Exam ?Vitals and nursing note reviewed.  ?Constitutional:   ?   Appearance: He is well-developed.  ?Pulmonary:  ?   Effort: Pulmonary effort is normal.  ?Musculoskeletal:     ?   General: Normal range of motion.  ?Skin: ?   General: Skin is warm.  ?Neurological:  ?   Mental Status: He is alert.  ?  ?Neurovascular status found to be intact muscle strength found to be adequate range of motion is within normal limits with inflammation fluid around the fifth MPJ right pain with pressure keratotic lesions of third fifth metatarsal right with pain.  Good digital perfusion well oriented x3 ? ?   ?Assessment:  ?Inflammatory capsulitis of the fifth MPJ right with fluid buildup around the joint surface with lesion formation subthird metatarsal ? ?   ?Plan:  ?H&P x-rays reviewed condition discussed and I went ahead today did sterile prep and injected around the fifth MPJ plantar capsule 3 mg dexamethasone Kenalog 5 mg Xylocaine debrided the lesions of third metatarsal advised on supportive shoe with good cushion and possibility at 1 point in future for metatarsal head resection depending on response to conservative treatment and made him aware of that today ? ?X-rays indicated some enlargement of the fifth metatarsal head negative for other pathology mild arthritis noted ?   ? ? ?

## 2022-02-11 DIAGNOSIS — E782 Mixed hyperlipidemia: Secondary | ICD-10-CM | POA: Diagnosis not present

## 2022-02-11 DIAGNOSIS — R7301 Impaired fasting glucose: Secondary | ICD-10-CM | POA: Diagnosis not present

## 2022-02-22 DIAGNOSIS — F17200 Nicotine dependence, unspecified, uncomplicated: Secondary | ICD-10-CM | POA: Diagnosis not present

## 2022-02-22 DIAGNOSIS — Z716 Tobacco abuse counseling: Secondary | ICD-10-CM | POA: Diagnosis not present

## 2022-02-22 DIAGNOSIS — I10 Essential (primary) hypertension: Secondary | ICD-10-CM | POA: Diagnosis not present

## 2022-02-22 DIAGNOSIS — R5383 Other fatigue: Secondary | ICD-10-CM | POA: Diagnosis not present

## 2022-02-22 DIAGNOSIS — E559 Vitamin D deficiency, unspecified: Secondary | ICD-10-CM | POA: Diagnosis not present

## 2022-06-14 DIAGNOSIS — M353 Polymyalgia rheumatica: Secondary | ICD-10-CM | POA: Diagnosis not present

## 2022-06-14 DIAGNOSIS — I10 Essential (primary) hypertension: Secondary | ICD-10-CM | POA: Diagnosis not present

## 2022-08-17 DIAGNOSIS — E559 Vitamin D deficiency, unspecified: Secondary | ICD-10-CM | POA: Diagnosis not present

## 2022-08-17 DIAGNOSIS — I10 Essential (primary) hypertension: Secondary | ICD-10-CM | POA: Diagnosis not present

## 2022-08-24 DIAGNOSIS — F17200 Nicotine dependence, unspecified, uncomplicated: Secondary | ICD-10-CM | POA: Diagnosis not present

## 2022-08-24 DIAGNOSIS — E875 Hyperkalemia: Secondary | ICD-10-CM | POA: Diagnosis not present

## 2022-08-24 DIAGNOSIS — R7301 Impaired fasting glucose: Secondary | ICD-10-CM | POA: Diagnosis not present

## 2022-08-24 DIAGNOSIS — E559 Vitamin D deficiency, unspecified: Secondary | ICD-10-CM | POA: Diagnosis not present

## 2022-08-24 DIAGNOSIS — D72829 Elevated white blood cell count, unspecified: Secondary | ICD-10-CM | POA: Diagnosis not present

## 2022-08-24 DIAGNOSIS — Z716 Tobacco abuse counseling: Secondary | ICD-10-CM | POA: Diagnosis not present

## 2022-08-24 DIAGNOSIS — E782 Mixed hyperlipidemia: Secondary | ICD-10-CM | POA: Diagnosis not present

## 2022-08-24 DIAGNOSIS — I10 Essential (primary) hypertension: Secondary | ICD-10-CM | POA: Diagnosis not present

## 2022-08-24 DIAGNOSIS — R5383 Other fatigue: Secondary | ICD-10-CM | POA: Diagnosis not present

## 2022-08-24 DIAGNOSIS — M353 Polymyalgia rheumatica: Secondary | ICD-10-CM | POA: Diagnosis not present

## 2022-08-24 DIAGNOSIS — Z0001 Encounter for general adult medical examination with abnormal findings: Secondary | ICD-10-CM | POA: Diagnosis not present

## 2022-10-19 ENCOUNTER — Encounter: Payer: Self-pay | Admitting: Podiatry

## 2022-10-19 ENCOUNTER — Ambulatory Visit (INDEPENDENT_AMBULATORY_CARE_PROVIDER_SITE_OTHER): Payer: PPO

## 2022-10-19 ENCOUNTER — Ambulatory Visit (INDEPENDENT_AMBULATORY_CARE_PROVIDER_SITE_OTHER): Payer: PPO | Admitting: Podiatry

## 2022-10-19 DIAGNOSIS — M7751 Other enthesopathy of right foot: Secondary | ICD-10-CM

## 2022-10-19 DIAGNOSIS — M21611 Bunion of right foot: Secondary | ICD-10-CM | POA: Diagnosis not present

## 2022-10-19 DIAGNOSIS — M21619 Bunion of unspecified foot: Secondary | ICD-10-CM

## 2022-10-19 DIAGNOSIS — L84 Corns and callosities: Secondary | ICD-10-CM | POA: Diagnosis not present

## 2022-10-19 MED ORDER — TRIAMCINOLONE ACETONIDE 10 MG/ML IJ SUSP
10.0000 mg | Freq: Once | INTRAMUSCULAR | Status: AC
  Administered 2022-10-19: 10 mg

## 2022-10-19 NOTE — Progress Notes (Signed)
Subjective:   Patient ID: Tony Stewart, male   DOB: 72 y.o.   MRN: [id number]   HPI Patient states he is having a lot of pain in the outside of his right foot again and does smoke 2 packs of cigarettes a day and admits that he is trying to reduce but he did have COVID and is having a lot of lung issues currently   ROS      Objective:  Physical Exam  Neurovascular status intact with inflammation of the fifth MPJ right fluid buildup and also has a lesion underneath the right foot around the fourth metatarsal that is painful when pressed.  Concerned about his overall health condition with significant shortness of breath     Assessment:  Inflammatory capsulitis fifth MPJ right lesion subfourth metatarsal right and not doing well with COVID     Plan:  H&P reviewed and at 1 point this may require surgery but not till I know he has been evaluated by pulmonologist wife strongly encouraged DC and also to reduce smoking.  I did do a sterile prep injected the fifth MPJ 3 mg Dexasone Kenalog 5 mg Xylocaine debrided lesion fully underneath the fourth metatarsal and will be seen back to recheck X-ray indicates the lesion is directly on the fifth metatarsal right and there is a secondary lesion fourth metatarsal right

## 2022-10-25 ENCOUNTER — Telehealth: Payer: Self-pay | Admitting: Pulmonary Disease

## 2022-10-25 ENCOUNTER — Ambulatory Visit (INDEPENDENT_AMBULATORY_CARE_PROVIDER_SITE_OTHER): Payer: PPO | Admitting: Pulmonary Disease

## 2022-10-25 ENCOUNTER — Ambulatory Visit (INDEPENDENT_AMBULATORY_CARE_PROVIDER_SITE_OTHER): Payer: PPO

## 2022-10-25 ENCOUNTER — Encounter: Payer: Self-pay | Admitting: Pulmonary Disease

## 2022-10-25 VITALS — BP 138/82 | HR 69 | Ht 73.0 in | Wt 228.6 lb

## 2022-10-25 DIAGNOSIS — R002 Palpitations: Secondary | ICD-10-CM

## 2022-10-25 DIAGNOSIS — R0600 Dyspnea, unspecified: Secondary | ICD-10-CM | POA: Diagnosis not present

## 2022-10-25 DIAGNOSIS — F17210 Nicotine dependence, cigarettes, uncomplicated: Secondary | ICD-10-CM

## 2022-10-25 DIAGNOSIS — J439 Emphysema, unspecified: Secondary | ICD-10-CM | POA: Diagnosis not present

## 2022-10-25 LAB — CBC WITH DIFFERENTIAL/PLATELET
Basophils Absolute: 0.1 10*3/uL (ref 0.0–0.1)
Basophils Relative: 0.6 % (ref 0.0–3.0)
Eosinophils Absolute: 0.2 10*3/uL (ref 0.0–0.7)
Eosinophils Relative: 2 % (ref 0.0–5.0)
HCT: 48.1 % (ref 39.0–52.0)
Hemoglobin: 16.2 g/dL (ref 13.0–17.0)
Lymphocytes Relative: 14.9 % (ref 12.0–46.0)
Lymphs Abs: 1.8 10*3/uL (ref 0.7–4.0)
MCHC: 33.7 g/dL (ref 30.0–36.0)
MCV: 98.2 fl (ref 78.0–100.0)
Monocytes Absolute: 1.1 10*3/uL — ABNORMAL HIGH (ref 0.1–1.0)
Monocytes Relative: 8.9 % (ref 3.0–12.0)
Neutro Abs: 8.8 10*3/uL — ABNORMAL HIGH (ref 1.4–7.7)
Neutrophils Relative %: 73.6 % (ref 43.0–77.0)
Platelets: 365 10*3/uL (ref 150.0–400.0)
RBC: 4.9 Mil/uL (ref 4.22–5.81)
RDW: 12.9 % (ref 11.5–15.5)
WBC: 12 10*3/uL — ABNORMAL HIGH (ref 4.0–10.5)

## 2022-10-25 LAB — D-DIMER, QUANTITATIVE: D-Dimer, Quant: 0.44 mcg/mL FEU (ref <0.50)

## 2022-10-25 MED ORDER — TIOTROPIUM BROMIDE-OLODATEROL 2.5-2.5 MCG/ACT IN AERS: 2.0000 | INHALATION_SPRAY | Freq: Every day | RESPIRATORY_TRACT | 5 refills | Status: DC

## 2022-10-25 MED ORDER — ALBUTEROL SULFATE HFA 108 (90 BASE) MCG/ACT IN AERS: 2.0000 | INHALATION_SPRAY | Freq: Four times a day (QID) | RESPIRATORY_TRACT | 5 refills | Status: AC | PRN

## 2022-10-25 NOTE — Progress Notes (Signed)
Synopsis: Referred in February 2024 for dyspnea  Subjective:   PATIENT ID: Tony Stewart GENDER: male DOB: [birth year], MRN: [id number]   HPI  Chief Complaint  Patient presents with   Consult    Sob, at rest and during exertion. 6-8 weeks ago    "Tony Stewart" had COVID in 09/2021 and had a "bad case" and then ended up having a stroke 11/2021. He doesn't have any energy since then He feels terrible in the morning when he wakes up Gets very short of breath when he walks to the door. He and his wife caught COVID again in January 2024 and now he feels terribly. Normally he stays pretty active outside, but right now he is having a really hard time breathing when he goes outside HIs main problem is that he feels winded He doesn't cough much, doesn't have chest congestion Symptoms really started after the January 2024 illness.  He says he wasn't as fatigued this go around with COVID, minimal cough.   He says that Thanksgiving and Christmas this year were fine, no breathing difficulty.  In general he had improvement in his dyspnea.   No associated chest pain, no leg swelling.  He feels like his heart is racing every once in a while (since January 2024), sometimes it happens when he's just sitting still.  No audible wheezing.  He has tried his wife's inhaler, this doesn't help.    He's never been told he has asthma or a lung problem.  He's always been active and never had difficulty with breathing.  He has smoked 1ppd since 1970.   He was drafted by the [employer] but then ended up going to [place].  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  No problems with dust, chemicals, dust.     Past Medical History:  Diagnosis Date   Arthritis    Cataracts, bilateral    Hypertension      Family History  Problem Relation Age of Onset   Cancer Mother      Social History   Socioeconomic History   Marital status: Single    Spouse name: Not on file   Number of  children: Not on file   Years of education: Not on file   Highest education level: Not on file  Occupational History   Not on file  Tobacco Use   Smoking status: Every Day    Packs/day: 2.00    Types: Cigarettes   Smokeless tobacco: Never  Substance and Sexual Activity   Alcohol use: Yes    Alcohol/week: 12.0 standard drinks of alcohol    Types: 12 Cans of beer per week    Comment: 6-12 budweisers a day   Drug use: No   Sexual activity: Not on file  Other Topics Concern   Not on file  Social History Narrative   ** Merged History Encounter **       Social Determinants of Health   Financial Resource Strain: Not on file  Food Insecurity: Not on file  Transportation Needs: Not on file  Physical Activity: Not on file  Stress: Not on file  Social Connections: Not on file  Intimate Partner Violence: Not on file     No Known Allergies   Outpatient Medications Prior to Visit  Medication Sig Dispense Refill   amLODipine (NORVASC) 10 MG tablet Take 10 mg by mouth daily.     losartan (COZAAR) 50 MG tablet Take 75 mg by mouth  daily.     No facility-administered medications prior to visit.    Review of Systems  Constitutional:  Positive for malaise/fatigue. Negative for chills, fever and weight loss.  HENT:  Negative for congestion, nosebleeds, sinus pain and sore throat.   Eyes:  Negative for photophobia, pain and discharge.  Respiratory:  Positive for shortness of breath. Negative for hemoptysis, sputum production and wheezing.   Cardiovascular:  Negative for chest pain, palpitations, orthopnea and leg swelling.  Gastrointestinal:  Negative for abdominal pain, constipation, diarrhea, nausea and vomiting.  Genitourinary:  Negative for dysuria, frequency, hematuria and urgency.  Musculoskeletal:  Negative for back pain, joint pain, myalgias and neck pain.  Skin:  Negative for itching and rash.  Neurological:  Negative for tingling, tremors, sensory change, speech change, focal  weakness, seizures, weakness and headaches.  Psychiatric/Behavioral:  Negative for memory loss, substance abuse and suicidal ideas. The patient is not nervous/anxious.       Objective:  Physical Exam   Vitals:   10/25/22 1337  BP: 138/82  Pulse: 69  SpO2: 98%  Weight: 228 lb 9.6 oz (103.7 kg)  Height: 6' 1"$  (1.854 m)    Gen: well appearing, no acute distress HENT: NCAT, OP clear, neck supple without masses Eyes: PERRL, EOMi Lymph: no cervical lymphadenopathy PULM: Wheezing left base B CV: RRR, no mgr, no JVD GI: BS+, soft, nontender, no hsm Derm: no rash or skin breakdown MSK: normal bulk and tone Neuro: A&Ox4, CN II-XII intact, strength 5/5 in all 4 extremities Psyche: normal mood and affect   CBC    Component Value Date/Time   WBC 7.5 12/04/2021 1134   RBC 5.06 12/04/2021 1134   HGB 17.0 12/04/2021 1134   HCT 50.8 12/04/2021 1134   PLT 287 12/04/2021 1134   MCV 100.4 (H) 12/04/2021 1134   MCH 33.6 12/04/2021 1134   MCHC 33.5 12/04/2021 1134   RDW 14.0 12/04/2021 1134     Chest imaging: March 2023 AP film chest x-ray images independently reviewed showing no pulmonary parenchymal infiltrate, normal cardiac silhouette  PFT:  Labs:  Path:  Echo:  Heart Catheterization:       Assessment & Plan:   Dyspnea, unspecified type - Plan: Pulmonary function test, DG Chest 2 View, D-Dimer, Quantitative, CBC w/Diff  Heart palpitations  Cigarette smoker  Discussion: Tony Stewart presents with dyspnea, wheezing after having COVID and has an extensive smoking history.  I question whether or not he has emphysema based on the March 2023 chest x-ray which I have reviewed where there appears to be some hyperinflation.  Based on his smoking history and wheezing I think it is very likely he has COPD.  Obviously, we need to test for this with lung function testing and keep in mind that there are other possibilities in the differential diagnosis, specifically anemia and blood  clots given the recent COVID.  Plan: Shortness of breath with wheezing, smoking history: Start Stiolto 2 puffs daily no matter how you feel Use albuterol as needed for chest tightness wheezing or shortness of breath Take the inhalers as I demonstrated today Practice good hand hygiene Stay physically active Pulmonary function test Ambulatory oximetry testing CBC Chest x-ray  Heart palpitations, recent COVID, shortness of breath: Check D-dimer blood test If this is positive you may need to have a CT angiogram of your chest to look for blood clot  Cigarette smoking: I strongly suggest that she stop smoking right away Call-1-800-quit-NOW get free to get nicotine replacement from the state  of [place] and counseling We will need to discuss lung cancer screening on the next visit  Follow-up 4 to 6 weeks, sooner if needed.  Immunizations: Immunization History  Administered Date(s) Administered   Tdap 05/06/2008     Current Outpatient Medications:    albuterol (VENTOLIN HFA) 108 (90 Base) MCG/ACT inhaler, Inhale 2 puffs into the lungs every 6 (six) hours as needed for wheezing or shortness of breath., Disp: 8 g, Rfl: 5   amLODipine (NORVASC) 10 MG tablet, Take 10 mg by mouth daily., Disp: , Rfl:    losartan (COZAAR) 50 MG tablet, Take 75 mg by mouth daily., Disp: , Rfl:    Tiotropium Bromide-Olodaterol 2.5-2.5 MCG/ACT AERS, Inhale 2 puffs into the lungs daily., Disp: 4 g, Rfl: 5

## 2022-10-25 NOTE — Patient Instructions (Signed)
Shortness of breath with wheezing, smoking history: Start Stiolto 2 puffs daily no matter how you feel Use albuterol as needed for chest tightness wheezing or shortness of breath Take the inhalers as I demonstrated today Practice good hand hygiene Stay physically active Pulmonary function test Ambulatory oximetry testing CBC Chest x-ray  Heart palpitations, recent COVID, shortness of breath: Check D-dimer blood test If this is positive you may need to have a CT angiogram of your chest to look for blood clot  Cigarette smoking: I strongly suggest that she stop smoking right away Call-1-800-quit-NOW get free to get nicotine replacement from the state of [place] and counseling We will need to discuss lung cancer screening on the next visit  Follow-up 4 to 6 weeks, sooner if needed.

## 2022-10-26 MED ORDER — TIOTROPIUM BROMIDE-OLODATEROL 2.5-2.5 MCG/ACT IN AERS: 2.0000 | INHALATION_SPRAY | Freq: Every day | RESPIRATORY_TRACT | 5 refills | Status: DC

## 2022-10-26 NOTE — Addendum Note (Signed)
Addended by: Meredith Staggers A on: 10/26/2022 10:16 AM   Modules accepted: Orders

## 2022-10-26 NOTE — Telephone Encounter (Signed)
Mardene Celeste states Tiotropium Bromide was not called into pharmacy,. Pharmacy is [facility]. Mardene Celeste phone number is [phone].

## 2022-10-26 NOTE — Telephone Encounter (Signed)
Spoke with patients wife she is requesting the results from lab work and chest xray. Dr. Lake Bells please advise?

## 2022-10-27 ENCOUNTER — Telehealth: Payer: Self-pay

## 2022-10-27 NOTE — Telephone Encounter (Signed)
Pt's spouse called for herself but pt asked for chest x-ray results while I was on the phone with spouse. Please advise. Was seen on 10/25/22

## 2022-10-28 NOTE — Progress Notes (Signed)
Spoke with pt and notified of results per [name]. Pt verbalized understanding and denied any questions. 

## 2022-10-28 NOTE — Telephone Encounter (Signed)
Spoke wife in regards to chest xray. She states patient was able to get Stiolto and states this is working well for him. Nothing further needed.

## 2022-11-02 ENCOUNTER — Telehealth: Payer: Self-pay

## 2022-11-02 NOTE — Telephone Encounter (Signed)
Sent pt  mychart message about chest x-ray and Dr Lake Bells interruption. Nothing further at this time.

## 2022-11-07 DIAGNOSIS — H00025 Hordeolum internum left lower eyelid: Secondary | ICD-10-CM | POA: Diagnosis not present

## 2022-11-07 DIAGNOSIS — H47012 Ischemic optic neuropathy, left eye: Secondary | ICD-10-CM | POA: Diagnosis not present

## 2022-11-07 DIAGNOSIS — H524 Presbyopia: Secondary | ICD-10-CM | POA: Diagnosis not present

## 2022-11-07 DIAGNOSIS — H2513 Age-related nuclear cataract, bilateral: Secondary | ICD-10-CM | POA: Diagnosis not present

## 2022-11-07 DIAGNOSIS — H35033 Hypertensive retinopathy, bilateral: Secondary | ICD-10-CM | POA: Diagnosis not present

## 2022-11-22 ENCOUNTER — Ambulatory Visit: Payer: PPO | Admitting: Pulmonary Disease

## 2022-11-22 ENCOUNTER — Ambulatory Visit: Payer: PPO | Admitting: Primary Care

## 2022-12-08 NOTE — Progress Notes (Signed)
Tony Stewart, male    DOB: [birth year]    MRN: [id number]   Brief patient profile:  38  yowm  active smoker  self - referred to pulmonary clinic in [place]  12/09/2022  for copd/ emphysema with pfts still pending      History of Present Illness  12/09/2022  Pulmonary/ 1st office eval/ Tony Stewart / [place] [facility]  Chief Complaint  Patient presents with   Follow-up    New pt appt pt was seen by [name]. He states that his breathing is good fromt 0-10 it is ranked @ 7. His inhaler are helping as well  Dyspnea:  slowed by foot / toting firewood sob and worse with [place] his property [place] acres  Cough: none  Sleep: on side / bed is flat SABA use: stiolto helped, no need for rescue 02: not using  LCS: declined   No obvious day to day or daytime pattern/variability or assoc excess/ purulent sputum or mucus plugs or hemoptysis or cp or chest tightness, subjective wheeze or overt sinus or hb symptoms.   Sleeping  without nocturnal  or early am exacerbation  of respiratory  c/o's or need for noct saba. Also denies any obvious fluctuation of symptoms with weather or environmental changes or other aggravating or alleviating factors except as outlined above   No unusual exposure hx or h/o childhood pna/ asthma or knowledge of premature birth.  Current Allergies, Complete Past Medical History, Past Surgical History, Family History, and Social History were reviewed in [employer] record.  ROS  The following are not active complaints unless bolded Hoarseness, sore throat, dysphagia, dental problems, itching, sneezing,  nasal congestion or discharge of excess mucus or purulent secretions, ear ache,   fever, chills, sweats, unintended wt loss or wt gain, classically pleuritic or exertional cp,  orthopnea pnd or arm/hand swelling  or leg swelling, presyncope, palpitations, abdominal pain, anorexia, nausea, vomiting, diarrhea  or change in bowel habits or change in  bladder habits, change in stools or change in urine, dysuria, hematuria,  rash, arthralgias, visual complaints, headache, numbness, weakness or ataxia or problems with walking or coordination,  change in mood or  memory.             Past Medical History:  Diagnosis Date   Arthritis    Cataracts, bilateral    Hypertension     Outpatient Medications Prior to Visit  Medication Sig Dispense Refill   albuterol (VENTOLIN HFA) 108 (90 Base) MCG/ACT inhaler Inhale 2 puffs into the lungs every 6 (six) hours as needed for wheezing or shortness of breath. 8 g 5   amLODipine (NORVASC) 10 MG tablet Take 10 mg by mouth daily.     losartan (COZAAR) 50 MG tablet Take 75 mg by mouth daily.     Tiotropium Bromide-Olodaterol 2.5-2.5 MCG/ACT AERS Inhale 2 puffs into the lungs daily. 4 g 5   No facility-administered medications prior to visit.     Objective:     BP 129/76   Pulse 80   Ht 6\' 1"  (1.854 m)   Wt 235 lb (106.6 kg)   BMI 31.00 kg/m    Amb wm nad   HEENT :  Oropharynx  clear/ edentulous      NECK :  without JVD/Nodes/TM/ nl carotid upstrokes bilaterally   LUNGS: no acc muscle use,  Mod barrel  contour chest wall with bilateral  Distant bs s audible wheeze and  without cough on insp  or exp maneuvers and mod  Hyperresonant  to  percussion bilaterally     CV:  RRR  no s3 or murmur or increase in P2, and no edema   ABD:  soft and nontender with pos mid insp Hoover's  in the supine position. No bruits or organomegaly appreciated, bowel sounds nl  MS:   Ext warm without deformities or   obvious joint restrictions , calf tenderness, cyanosis or clubbing  SKIN: warm and dry without lesions    NEURO:  alert, approp, nl sensorium with  no motor or cerebellar deficits apparent.             I personally reviewed images and agree with radiology impression as follows:  CXR:   Pa and Lateral  Emphysematous changes in the lungs.   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   Says breathing better since started stiolto c/w Pt is Group B in terms of symptom/risk and laba/lama therefore appropriate rx at this point >>>  stiolto approp but needs pfts for baseline   - The proper method of use, as well as anticipated side effects, of a SMI/respimat inhaler were discussed and demonstrated to the patient using teach back method and an empty device  F/u yearly with pfts in meantime   Cigarette smoker 4-5 min discussion re active cigarette smoking in addition to office E&M  Ask about tobacco use:   ongoing Advise quitting   I took an extended  opportunity with this patient to outline the consequences of continued cigarette use  in airway disorders based on all the data we have from the multiple national lung health studies (perfomed over decades at millions of dollars in cost)  indicating that smoking cessation, not choice of inhalers or physicians, is the most important aspect of his care.   Assess willingness:  Not committed at this point Assist in quit attempt:  Per PCP when ready Arrange follow up:   Follow up per Primary Care planned   Also declined lung cancer screening .  F/u yearly sooner prn   Each maintenance medication was reviewed in detail including emphasizing most importantly the difference between maintenance and prns and under what circumstances the prns are to be triggered using an action plan format where appropriate.  Total time for H and P, chart review, counseling, reviewing smi device(s) , directly observing portions of ambulatory 02 saturation study/ and generating customized AVS unique to this  office visit / same day charting > 40 min                       Christinia Gully, MD 12/09/2022

## 2022-12-09 ENCOUNTER — Encounter: Payer: Self-pay | Admitting: Internal Medicine

## 2022-12-09 ENCOUNTER — Ambulatory Visit (INDEPENDENT_AMBULATORY_CARE_PROVIDER_SITE_OTHER): Payer: PPO | Admitting: Internal Medicine

## 2022-12-09 VITALS — BP 129/76 | HR 80 | Ht 73.0 in | Wt 235.0 lb

## 2022-12-09 DIAGNOSIS — F17210 Nicotine dependence, cigarettes, uncomplicated: Secondary | ICD-10-CM | POA: Diagnosis not present

## 2022-12-09 DIAGNOSIS — J449 Chronic obstructive pulmonary disease, unspecified: Secondary | ICD-10-CM | POA: Diagnosis not present

## 2022-12-09 MED ORDER — TIOTROPIUM BROMIDE-OLODATEROL 2.5-2.5 MCG/ACT IN AERS
2.0000 | INHALATION_SPRAY | Freq: Every day | RESPIRATORY_TRACT | 11 refills | Status: DC
Start: 2022-12-09 — End: 2024-03-14

## 2022-12-09 NOTE — Assessment & Plan Note (Signed)
4-5 min discussion re active cigarette smoking in addition to office E&M  Ask about tobacco use:   ongoing Advise quitting   I took an extended  opportunity with this patient to outline the consequences of continued cigarette use  in airway disorders based on all the data we have from the multiple national lung health studies (perfomed over decades at millions of dollars in cost)  indicating that smoking cessation, not choice of inhalers or physicians, is the most important aspect of his care.   Assess willingness:  Not committed at this point Assist in quit attempt:  Per PCP when ready Arrange follow up:   Follow up per Primary Care planned   Also declined lung cancer screening .  F/u yearly sooner prn   Each maintenance medication was reviewed in detail including emphasizing most importantly the difference between maintenance and prns and under what circumstances the prns are to be triggered using an action plan format where appropriate.  Total time for H and P, chart review, counseling, reviewing smi device(s) , directly observing portions of ambulatory 02 saturation study/ and generating customized AVS unique to this office visit / same day charting > 40 min

## 2022-12-09 NOTE — Patient Instructions (Addendum)
I do recommend a low dose screening CT yearly until age 72 and alpha one AT phenotype  [facility] - discuss with your primary care provider   My office will be contacting you by phone for referral for PFTs   - if you don't hear back from my office within one week please call us back or notify us thru MyChart and we'll address it right away.   Work on inhaler technique:  relax and gently blow all the way out then take a nice smooth full deep breath back in, triggering the inhaler at same time you start breathing in.  Hold breath in for at least  5 seconds if you can.  Rinse and gargle with water when done.       Please schedule a follow up visit in 12  months but call sooner if needed [phone]

## 2022-12-09 NOTE — Assessment & Plan Note (Addendum)
Active smoker - classic findings of mod copd on exam coupled with mod hyperinflation on cxr - rec alpha one AT phenotype > declined - 12/09/2022   Walked on RA  x  3  lap(s) =  approx 450  ft  @ mod fast pace, stopped due to end of study s sob with lowest 02 sats 91% lowest.   Says breathing better since started stiolto c/w Pt is Group B in terms of symptom/risk and laba/lama therefore appropriate rx at this point >>>  stiolto approp but needs pfts for baseline   - The proper method of use, as well as anticipated side effects, of a SMI/respimat inhaler were discussed and demonstrated to the patient using teach back method and an empty device  F/u yearly with pfts in meantime

## 2023-03-28 DIAGNOSIS — E559 Vitamin D deficiency, unspecified: Secondary | ICD-10-CM | POA: Diagnosis not present

## 2023-03-28 DIAGNOSIS — I10 Essential (primary) hypertension: Secondary | ICD-10-CM | POA: Diagnosis not present

## 2023-04-04 DIAGNOSIS — E782 Mixed hyperlipidemia: Secondary | ICD-10-CM | POA: Diagnosis not present

## 2023-04-04 DIAGNOSIS — E669 Obesity, unspecified: Secondary | ICD-10-CM | POA: Diagnosis not present

## 2023-04-04 DIAGNOSIS — F17210 Nicotine dependence, cigarettes, uncomplicated: Secondary | ICD-10-CM | POA: Diagnosis not present

## 2023-04-04 DIAGNOSIS — F1090 Alcohol use, unspecified, uncomplicated: Secondary | ICD-10-CM | POA: Diagnosis not present

## 2023-04-04 DIAGNOSIS — J449 Chronic obstructive pulmonary disease, unspecified: Secondary | ICD-10-CM | POA: Diagnosis not present

## 2023-04-04 DIAGNOSIS — E875 Hyperkalemia: Secondary | ICD-10-CM | POA: Diagnosis not present

## 2023-04-04 DIAGNOSIS — Z0001 Encounter for general adult medical examination with abnormal findings: Secondary | ICD-10-CM | POA: Diagnosis not present

## 2023-04-04 DIAGNOSIS — E559 Vitamin D deficiency, unspecified: Secondary | ICD-10-CM | POA: Diagnosis not present

## 2023-04-04 DIAGNOSIS — I10 Essential (primary) hypertension: Secondary | ICD-10-CM | POA: Diagnosis not present

## 2023-04-04 DIAGNOSIS — R7301 Impaired fasting glucose: Secondary | ICD-10-CM | POA: Diagnosis not present

## 2023-04-04 DIAGNOSIS — I700 Atherosclerosis of aorta: Secondary | ICD-10-CM | POA: Diagnosis not present

## 2023-07-11 IMAGING — CT CT HEAD W/O CM
4 series · 15 of 47 positions shown, 17 images · non-contrast
Comparison: No priors.

CLINICAL DATA: 70-year-old male with history of persistent
recurrent dizziness.



[Series 2: head w o · axial · 0.47mm/px · z∈[-27,+103]mm · 7 of 36 slices shown, 9 images]
[im 5/36  brain]
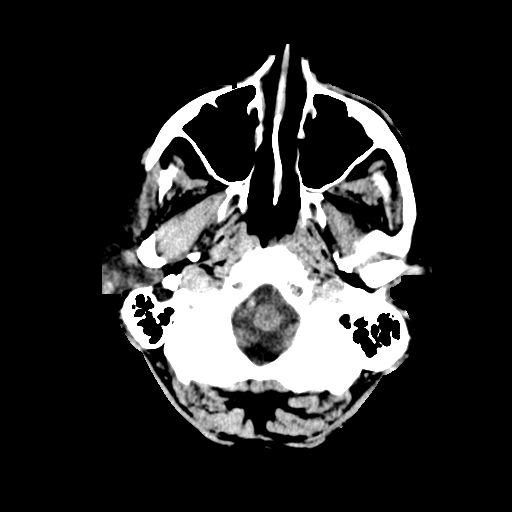
[im 5/36  bone]
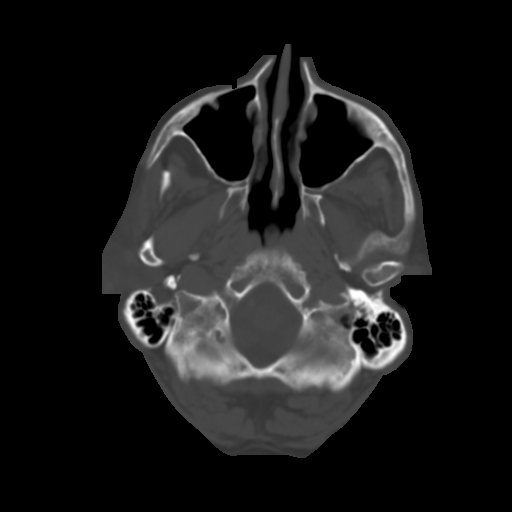
[im 9/36  brain]
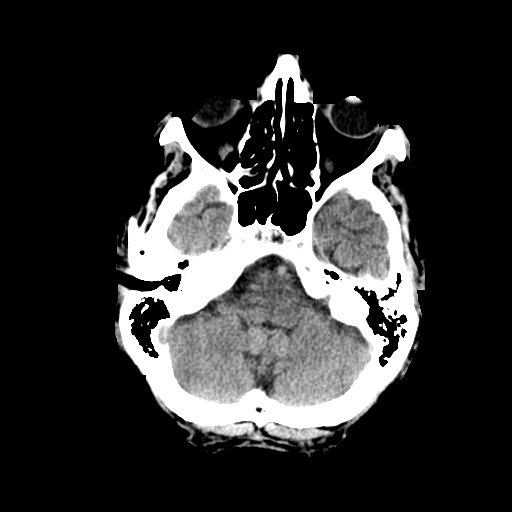
[im 14/36  brain]
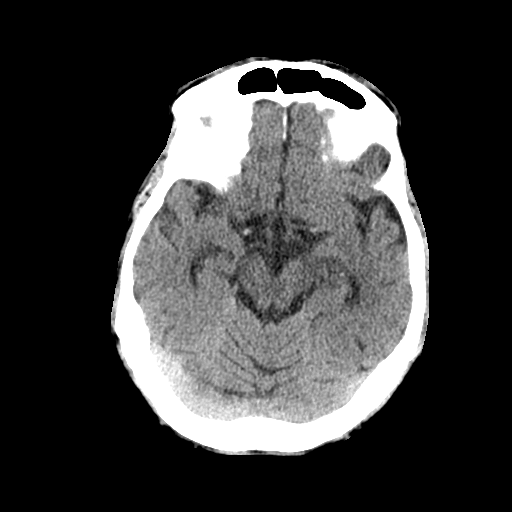
[im 18/36  brain]
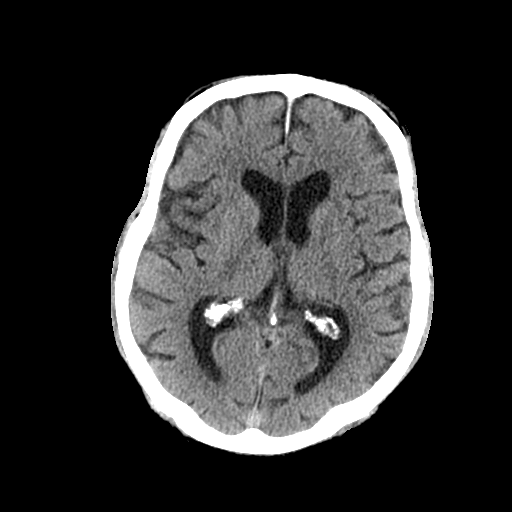
[im 22/36  brain]
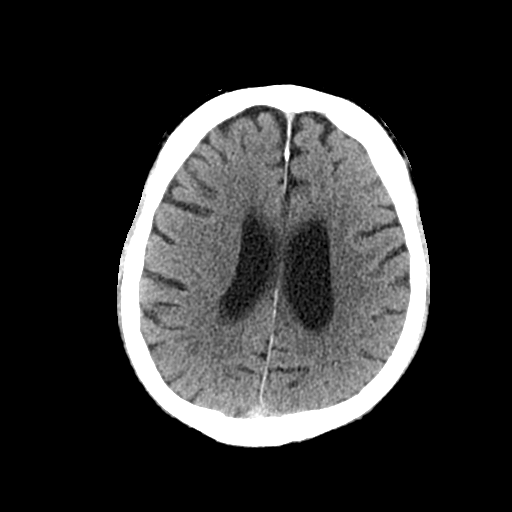
[im 22/36  bone]
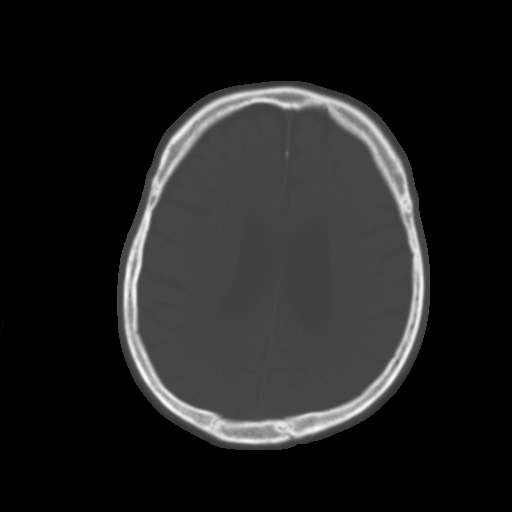
[im 27/36  brain]
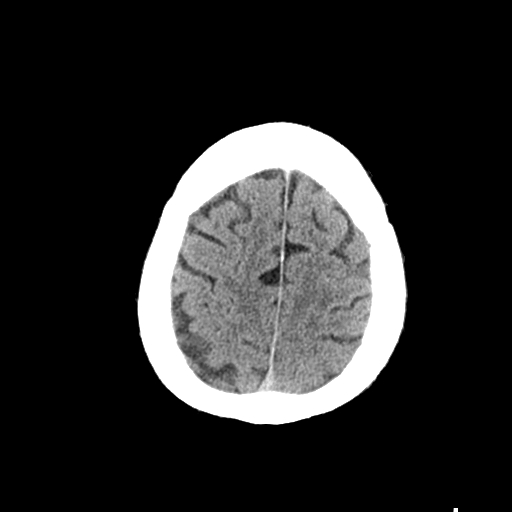
[im 31/36  brain]
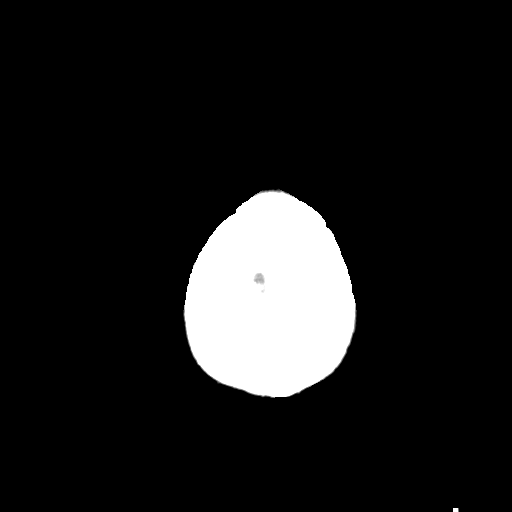

[Series 3: head bone · axial · 0.47mm/px · z∈[-31,-13]mm · 2 of 89 slices shown]
[im 9/89  bone]
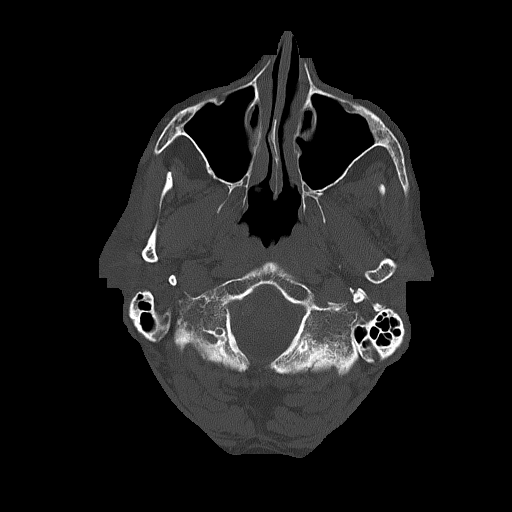
[im 18/89  bone]
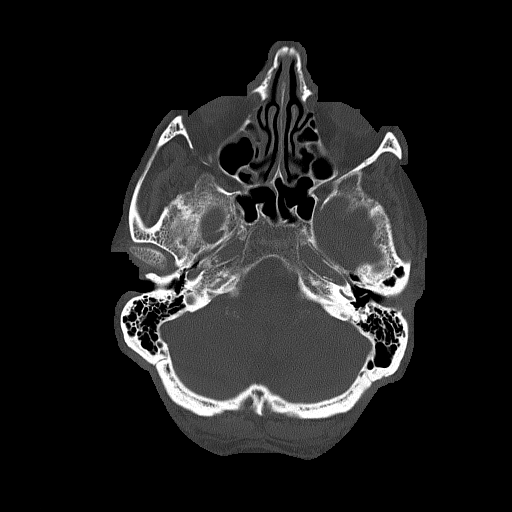

[Series 4: coronal soft · coronal · 0.35mm/px · 3 of 77 slices shown]
[im 26/77  brain]
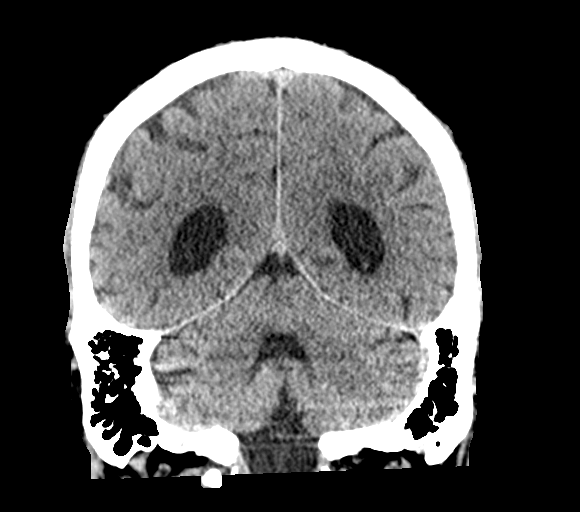
[im 34/77  brain]
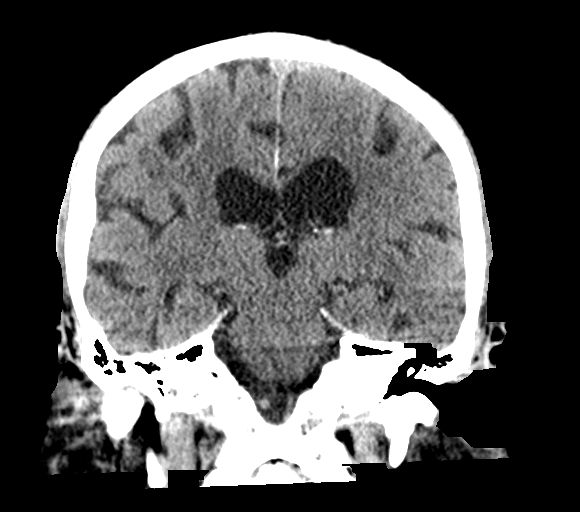
[im 43/77  brain]
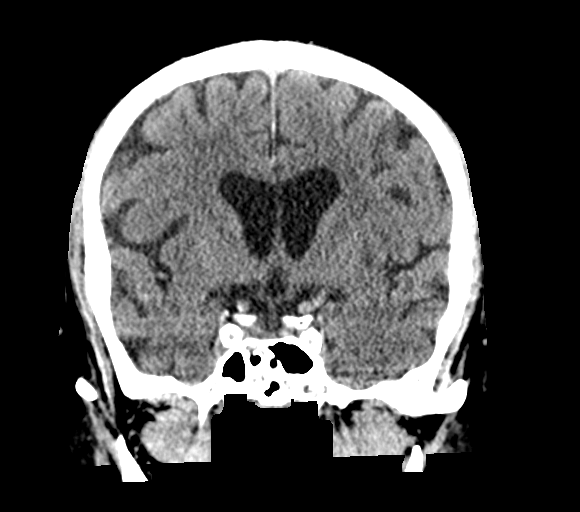

[Series 5: sagittal soft · sagittal · 0.34mm/px · 3 of 63 slices shown]
[im 21/63  brain]
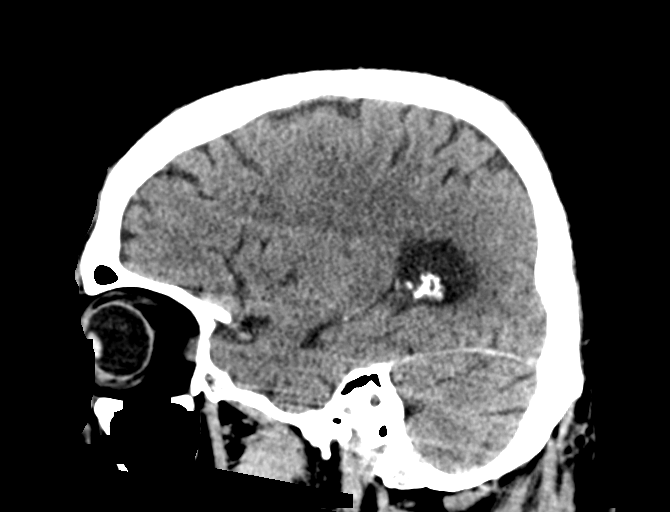
[im 32/63  brain]
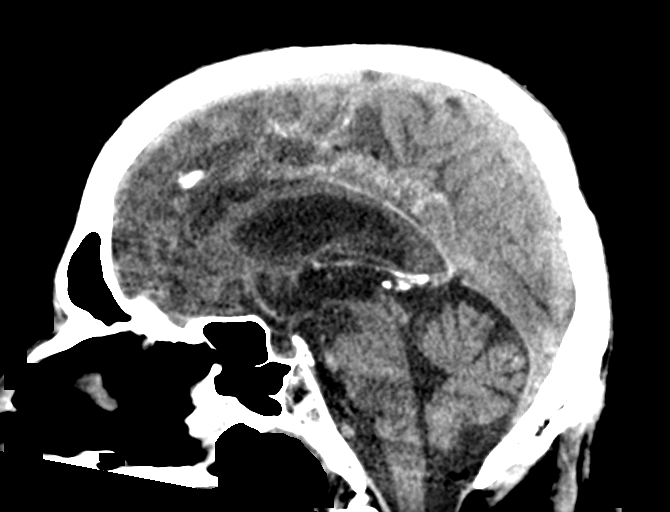
[im 42/63  brain]
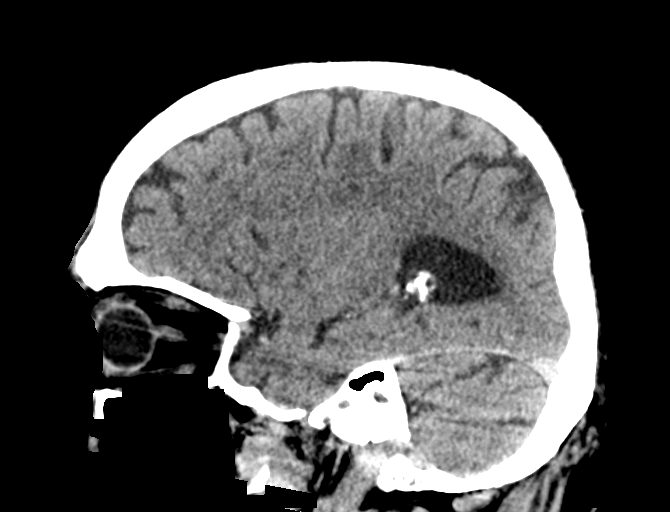

[15 of 47 positions shown; findings below may reference images not displayed]

FINDINGS: CT HEAD FINDINGS

Brain: Patchy areas of mild decreased attenuation are noted
throughout the deep and periventricular white matter of the cerebral
hemispheres bilaterally, compatible with mild chronic microvascular
ischemic disease. No evidence of acute infarction, hemorrhage,
hydrocephalus, extra-axial collection or mass lesion/mass effect.

Vascular: No hyperdense vessel or unexpected calcification.

Skull: Normal. Negative for fracture or focal lesion.

Sinuses/Orbits: No acute finding.

Other: None.

CT CERVICAL SPINE FINDINGS

Alignment: Normal.

Skull base and vertebrae: No acute fracture. No primary bone lesion
or focal pathologic process.

Soft tissues and spinal canal: No prevertebral fluid or swelling. No
visible canal hematoma.

Disc levels: Minimal multilevel degenerative disc disease, most
evident at C6-C7 and C7-T1. Mild multilevel facet arthropathy.

Upper chest: Negative.

Other: None.
IMPRESSION: 1. No acute intracranial abnormalities.
2. No acute abnormality of the cervical spine.
3. Mild chronic microvascular ischemic changes in the cerebral white
matter, as above.
4. Mild multilevel degenerative disc disease and cervical
spondylosis, as above.

## 2023-07-11 IMAGING — DX DG CHEST 1V PORT
1 series · 1 of 1 positions shown · non-contrast
Comparison: No priors.

CLINICAL DATA: 70-year-old male with history of chest pain for the
past 2 weeks.

EXAM:
PORTABLE CHEST 1 VIEW

[chest ap]
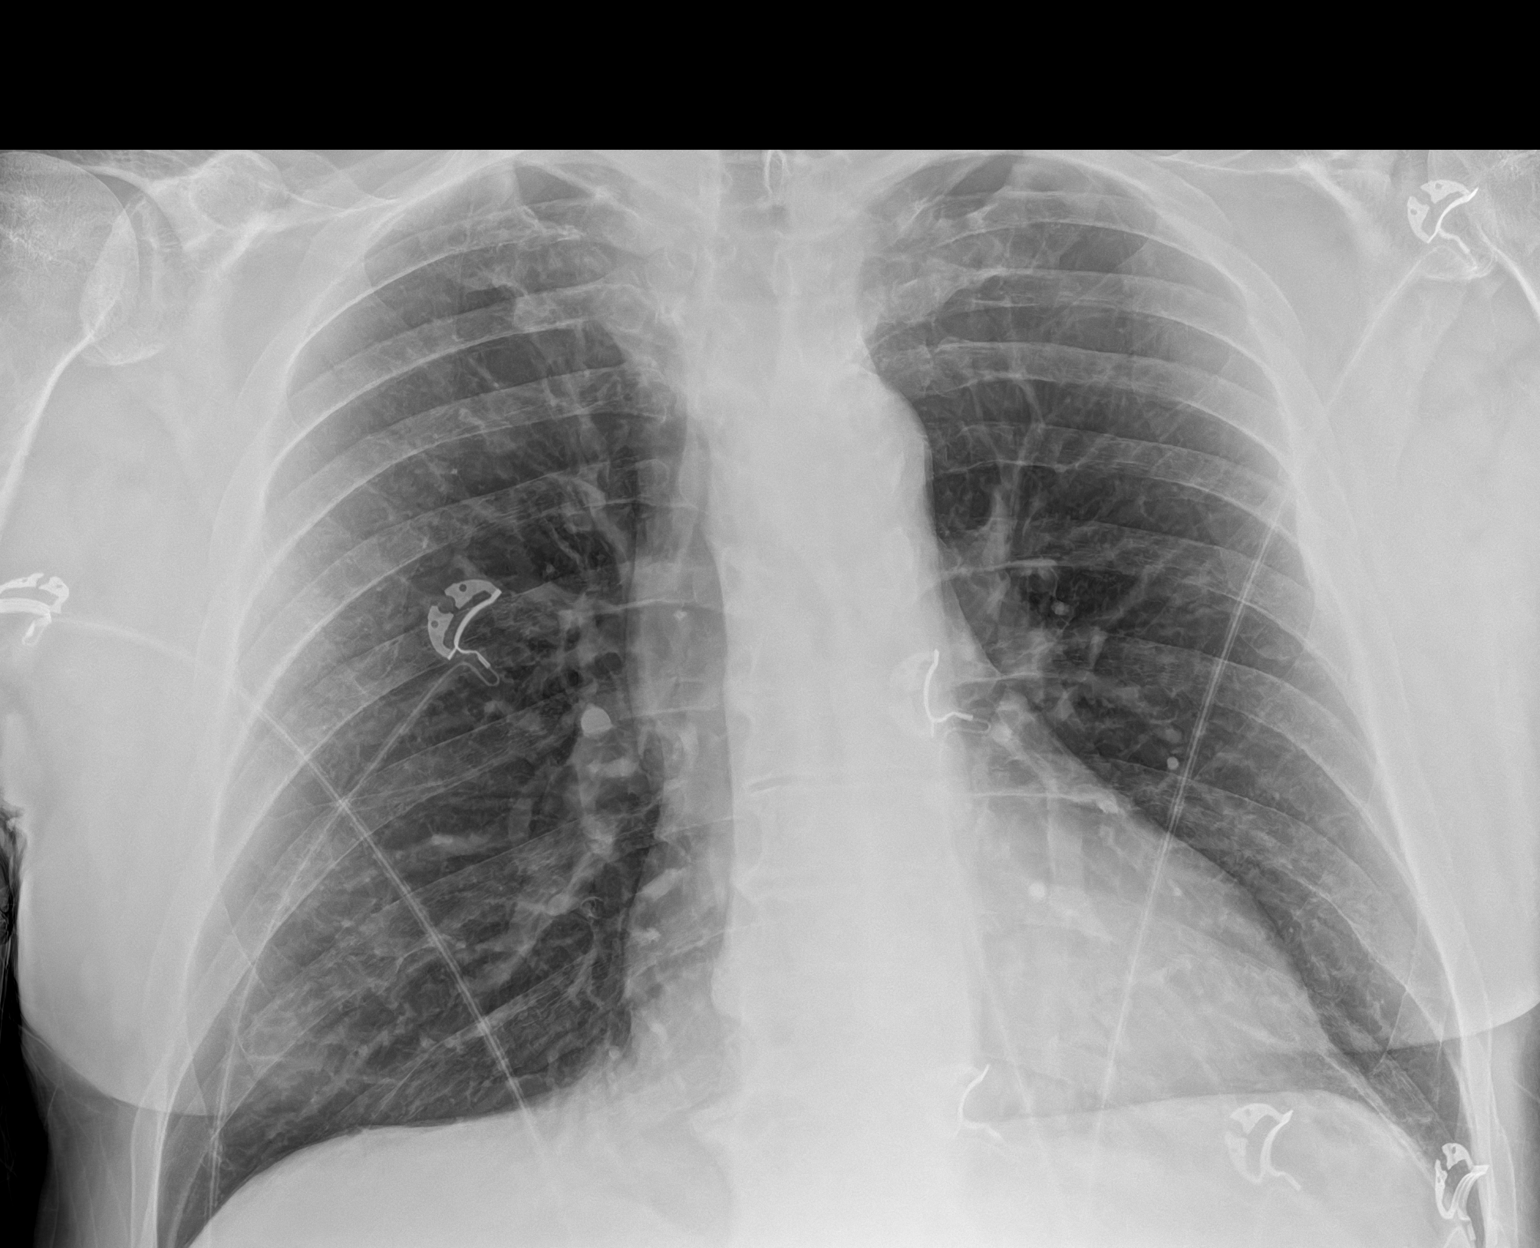

[1 of 1 positions shown; findings below may reference images not displayed]

FINDINGS: Lung volumes are normal. No consolidative airspace disease. No
pleural effusions. No pneumothorax. No pulmonary nodule or mass
noted. Pulmonary vasculature and the cardiomediastinal silhouette
are within normal limits. Atherosclerosis in the thoracic aorta.
IMPRESSION: 1.  No radiographic evidence of acute cardiopulmonary disease.
2. Aortic atherosclerosis.

## 2023-08-31 ENCOUNTER — Other Ambulatory Visit (HOSPITAL_COMMUNITY): Payer: Self-pay | Admitting: Internal Medicine

## 2023-08-31 DIAGNOSIS — M25512 Pain in left shoulder: Secondary | ICD-10-CM | POA: Diagnosis not present

## 2023-08-31 DIAGNOSIS — R2689 Other abnormalities of gait and mobility: Secondary | ICD-10-CM | POA: Diagnosis not present

## 2023-08-31 DIAGNOSIS — M79605 Pain in left leg: Secondary | ICD-10-CM | POA: Diagnosis not present

## 2023-08-31 DIAGNOSIS — I10 Essential (primary) hypertension: Secondary | ICD-10-CM | POA: Diagnosis not present

## 2023-08-31 DIAGNOSIS — M79642 Pain in left hand: Secondary | ICD-10-CM | POA: Diagnosis not present

## 2023-08-31 DIAGNOSIS — M79604 Pain in right leg: Secondary | ICD-10-CM | POA: Diagnosis not present

## 2023-08-31 DIAGNOSIS — E669 Obesity, unspecified: Secondary | ICD-10-CM | POA: Diagnosis not present

## 2023-08-31 DIAGNOSIS — F17200 Nicotine dependence, unspecified, uncomplicated: Secondary | ICD-10-CM | POA: Diagnosis not present

## 2023-08-31 DIAGNOSIS — M79641 Pain in right hand: Secondary | ICD-10-CM | POA: Diagnosis not present

## 2023-08-31 DIAGNOSIS — M25511 Pain in right shoulder: Secondary | ICD-10-CM | POA: Diagnosis not present

## 2023-08-31 DIAGNOSIS — R52 Pain, unspecified: Secondary | ICD-10-CM | POA: Diagnosis not present

## 2023-08-31 DIAGNOSIS — G8929 Other chronic pain: Secondary | ICD-10-CM | POA: Diagnosis not present

## 2023-09-04 ENCOUNTER — Ambulatory Visit (HOSPITAL_COMMUNITY)
Admission: RE | Admit: 2023-09-04 | Discharge: 2023-09-04 | Disposition: A | Discharge status: Home / Self Care | Payer: PPO | Source: Ambulatory Visit | Attending: Internal Medicine | Admitting: Internal Medicine

## 2023-09-04 DIAGNOSIS — R2681 Unsteadiness on feet: Secondary | ICD-10-CM | POA: Diagnosis not present

## 2023-09-04 DIAGNOSIS — R2689 Other abnormalities of gait and mobility: Secondary | ICD-10-CM | POA: Insufficient documentation

## 2023-09-29 DIAGNOSIS — Z125 Encounter for screening for malignant neoplasm of prostate: Secondary | ICD-10-CM | POA: Diagnosis not present

## 2023-09-29 DIAGNOSIS — E559 Vitamin D deficiency, unspecified: Secondary | ICD-10-CM | POA: Diagnosis not present

## 2023-09-29 DIAGNOSIS — I10 Essential (primary) hypertension: Secondary | ICD-10-CM | POA: Diagnosis not present

## 2023-10-05 DIAGNOSIS — Z716 Tobacco abuse counseling: Secondary | ICD-10-CM | POA: Diagnosis not present

## 2023-10-05 DIAGNOSIS — I700 Atherosclerosis of aorta: Secondary | ICD-10-CM | POA: Diagnosis not present

## 2023-10-05 DIAGNOSIS — I10 Essential (primary) hypertension: Secondary | ICD-10-CM | POA: Diagnosis not present

## 2023-10-05 DIAGNOSIS — E782 Mixed hyperlipidemia: Secondary | ICD-10-CM | POA: Diagnosis not present

## 2023-10-05 DIAGNOSIS — E875 Hyperkalemia: Secondary | ICD-10-CM | POA: Diagnosis not present

## 2023-10-05 DIAGNOSIS — E559 Vitamin D deficiency, unspecified: Secondary | ICD-10-CM | POA: Diagnosis not present

## 2023-10-05 DIAGNOSIS — R2689 Other abnormalities of gait and mobility: Secondary | ICD-10-CM | POA: Diagnosis not present

## 2023-10-05 DIAGNOSIS — R52 Pain, unspecified: Secondary | ICD-10-CM | POA: Diagnosis not present

## 2023-10-05 DIAGNOSIS — R7301 Impaired fasting glucose: Secondary | ICD-10-CM | POA: Diagnosis not present

## 2023-10-05 DIAGNOSIS — J449 Chronic obstructive pulmonary disease, unspecified: Secondary | ICD-10-CM | POA: Diagnosis not present

## 2023-10-05 DIAGNOSIS — F17200 Nicotine dependence, unspecified, uncomplicated: Secondary | ICD-10-CM | POA: Diagnosis not present

## 2023-10-10 DIAGNOSIS — M549 Dorsalgia, unspecified: Secondary | ICD-10-CM | POA: Diagnosis not present

## 2023-10-10 DIAGNOSIS — Z79899 Other long term (current) drug therapy: Secondary | ICD-10-CM | POA: Diagnosis not present

## 2023-10-10 DIAGNOSIS — M25551 Pain in right hip: Secondary | ICD-10-CM | POA: Diagnosis not present

## 2023-10-10 DIAGNOSIS — M25569 Pain in unspecified knee: Secondary | ICD-10-CM | POA: Diagnosis not present

## 2023-10-10 DIAGNOSIS — M79672 Pain in left foot: Secondary | ICD-10-CM | POA: Diagnosis not present

## 2023-10-10 DIAGNOSIS — M7989 Other specified soft tissue disorders: Secondary | ICD-10-CM | POA: Diagnosis not present

## 2023-10-10 DIAGNOSIS — M79643 Pain in unspecified hand: Secondary | ICD-10-CM | POA: Diagnosis not present

## 2023-10-10 DIAGNOSIS — M2550 Pain in unspecified joint: Secondary | ICD-10-CM | POA: Diagnosis not present

## 2023-10-10 DIAGNOSIS — M25511 Pain in right shoulder: Secondary | ICD-10-CM | POA: Diagnosis not present

## 2023-10-10 DIAGNOSIS — M79642 Pain in left hand: Secondary | ICD-10-CM | POA: Diagnosis not present

## 2023-10-10 DIAGNOSIS — M79671 Pain in right foot: Secondary | ICD-10-CM | POA: Diagnosis not present

## 2023-10-10 DIAGNOSIS — M25512 Pain in left shoulder: Secondary | ICD-10-CM | POA: Diagnosis not present

## 2023-10-10 DIAGNOSIS — M1990 Unspecified osteoarthritis, unspecified site: Secondary | ICD-10-CM | POA: Diagnosis not present

## 2023-10-10 DIAGNOSIS — M79641 Pain in right hand: Secondary | ICD-10-CM | POA: Diagnosis not present

## 2023-10-10 DIAGNOSIS — M25552 Pain in left hip: Secondary | ICD-10-CM | POA: Diagnosis not present

## 2023-10-30 DIAGNOSIS — J09X2 Influenza due to identified novel influenza A virus with other respiratory manifestations: Secondary | ICD-10-CM | POA: Diagnosis not present

## 2023-10-30 DIAGNOSIS — J101 Influenza due to other identified influenza virus with other respiratory manifestations: Secondary | ICD-10-CM | POA: Diagnosis not present

## 2023-10-30 DIAGNOSIS — I10 Essential (primary) hypertension: Secondary | ICD-10-CM | POA: Diagnosis not present

## 2023-10-30 DIAGNOSIS — J449 Chronic obstructive pulmonary disease, unspecified: Secondary | ICD-10-CM | POA: Diagnosis not present

## 2023-11-13 DIAGNOSIS — H47012 Ischemic optic neuropathy, left eye: Secondary | ICD-10-CM | POA: Diagnosis not present

## 2023-11-13 DIAGNOSIS — H35033 Hypertensive retinopathy, bilateral: Secondary | ICD-10-CM | POA: Diagnosis not present

## 2023-11-13 DIAGNOSIS — H2513 Age-related nuclear cataract, bilateral: Secondary | ICD-10-CM | POA: Diagnosis not present

## 2023-11-13 DIAGNOSIS — H35362 Drusen (degenerative) of macula, left eye: Secondary | ICD-10-CM | POA: Diagnosis not present

## 2023-11-13 DIAGNOSIS — H4323 Crystalline deposits in vitreous body, bilateral: Secondary | ICD-10-CM | POA: Diagnosis not present

## 2023-11-13 DIAGNOSIS — H524 Presbyopia: Secondary | ICD-10-CM | POA: Diagnosis not present

## 2023-11-16 ENCOUNTER — Telehealth: Payer: Self-pay | Admitting: Orthopaedic Surgery

## 2023-11-16 DIAGNOSIS — M79671 Pain in right foot: Secondary | ICD-10-CM

## 2023-11-16 NOTE — Telephone Encounter (Signed)
 I spoke with patient's wife. She states that he has seen you for his foot before and has been seeing foot center. They have cleaned up foot and given him injections, however, state that he is going to need surgery on his foot because the bone is deteriorating. Elease Hashimoto would like for you to refer to foot specialist.  OK to enter referral to Dr. Lajoyce Corners?

## 2023-11-16 NOTE — Telephone Encounter (Signed)
 Patient's wife called. Says he needs a referral for his foot.

## 2023-11-16 NOTE — Telephone Encounter (Signed)
 Referral entered. Patient's wife aware.

## 2023-11-23 DIAGNOSIS — Z8739 Personal history of other diseases of the musculoskeletal system and connective tissue: Secondary | ICD-10-CM | POA: Diagnosis not present

## 2023-11-23 DIAGNOSIS — M359 Systemic involvement of connective tissue, unspecified: Secondary | ICD-10-CM | POA: Diagnosis not present

## 2023-11-23 DIAGNOSIS — M1990 Unspecified osteoarthritis, unspecified site: Secondary | ICD-10-CM | POA: Diagnosis not present

## 2023-11-23 DIAGNOSIS — M0609 Rheumatoid arthritis without rheumatoid factor, multiple sites: Secondary | ICD-10-CM | POA: Diagnosis not present

## 2023-11-23 DIAGNOSIS — M130 Polyarthritis, unspecified: Secondary | ICD-10-CM | POA: Diagnosis not present

## 2023-11-23 DIAGNOSIS — Z79899 Other long term (current) drug therapy: Secondary | ICD-10-CM | POA: Diagnosis not present

## 2023-11-23 DIAGNOSIS — M2550 Pain in unspecified joint: Secondary | ICD-10-CM | POA: Diagnosis not present

## 2023-11-23 DIAGNOSIS — R700 Elevated erythrocyte sedimentation rate: Secondary | ICD-10-CM | POA: Diagnosis not present

## 2023-11-23 DIAGNOSIS — R768 Other specified abnormal immunological findings in serum: Secondary | ICD-10-CM | POA: Diagnosis not present

## 2023-11-30 ENCOUNTER — Ambulatory Visit: Admitting: Orthopedic Surgery

## 2023-11-30 ENCOUNTER — Encounter: Payer: Self-pay | Admitting: Orthopedic Surgery

## 2023-11-30 DIAGNOSIS — M79671 Pain in right foot: Secondary | ICD-10-CM | POA: Diagnosis not present

## 2023-11-30 DIAGNOSIS — M6701 Short Achilles tendon (acquired), right ankle: Secondary | ICD-10-CM | POA: Diagnosis not present

## 2023-11-30 DIAGNOSIS — M216X1 Other acquired deformities of right foot: Secondary | ICD-10-CM | POA: Diagnosis not present

## 2023-11-30 NOTE — Progress Notes (Signed)
 Office Visit Note   Patient: Tony Stewart           Date of Birth: 08/11/1951           MRN: [id number] Visit Date: 11/30/2023              Requested by: Eldred Manges, MD [address] [place],  [place] [address] PCP: Benita Stabile, MD  Chief Complaint  Patient presents with   Right Foot - Pain      HPI: Patient is a 73 year old gentleman who is seen for pain right foot beneath the fifth metatarsal head.  Patient has a painful callus and has pain with shoe wear and walking.  Patient states that he saw podiatry last year and he states a bone shaving procedure was recommended.  Assessment & Plan: Visit Diagnoses:  1. 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  Also recommended an extra-depth sneaker with sole orthotics with a met pad and the orthotic removed from beneath the first metatarsal head to allow for plantarflexion of the first ray and decrease pressure on the lateral column.  Follow-Up Instructions: Return if symptoms worsen or fail to improve.   Ortho Exam  Patient is alert, oriented, no adenopathy, well-dressed, normal affect, normal respiratory effort. 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  Patient has a hyperkeratotic lesion beneath the fifth metatarsal head.  After informed consent a 10 blade knife was used to pare the hyperkeratotic lesion.  There is no signs of infection no signs of a plantar wart.  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  Patient has a palpable posterior tibial pulse.  No arterial insufficiency.  Imaging: No results found. No images are attached to the encounter.  Labs: No results found for: "HGBA1C", "ESRSEDRATE", "CRP", "LABURIC", "REPTSTATUS", "GRAMSTAIN", "CULT", "LABORGA"   Lab Results  Component Value Date   ALBUMIN 4.2 12/04/2021    No results found for:  "MG" No results found for: "VD25OH"  No results found for: "PREALBUMIN"    Latest Ref Rng & Units 10/25/2022    2:55 PM 12/04/2021   11:34 AM  CBC EXTENDED  WBC 4.0 - 10.5 K/uL 12.0  7.5   RBC 4.22 - 5.81 Mil/uL 4.90  5.06   Hemoglobin 13.0 - 17.0 g/dL 19.1  47.8   HCT 29.5 - 52.0 % 48.1  50.8   Platelets 150.0 - 400.0 K/uL 365.0  287   NEUT# 1.4 - 7.7 K/uL 8.8    Lymph# 0.7 - 4.0 K/uL 1.8       There is no height or weight on file to calculate BMI.  Orders:  No orders of the defined types were placed in this encounter.  No orders of the defined types were placed in this encounter.    Procedures: No procedures performed  Clinical Data: No additional findings.  ROS:  All other systems negative, except as noted in the HPI. Review of Systems  Objective: Vital Signs: There were no vitals taken for this visit.  Specialty Comments:  No specialty comments available.  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 No pertinent surgical history. Social History   Occupational History   Not on file  Tobacco Use   Smoking status: Every Day    Current packs/day: 2.00    Average packs/day: 2.0 packs/day for 55.1 years (110.2 ttl pk-yrs)    Types: Cigarettes    Start date: 11/01/1968   Smokeless tobacco: Never  Substance and Sexual Activity   Alcohol use: Yes    Alcohol/week: 12.0 standard drinks of alcohol    Types: 12 Cans of beer per week    Comment: 6-12 budweisers a day   Drug use: No   Sexual activity: Not on file

## 2023-12-08 NOTE — Progress Notes (Unsigned)
 Tony Stewart, male    DOB: 03/07/1951    MRN: [id number]   Brief patient profile:  73  yowm  active smoker  self - referred to pulmonary clinic in [place]  12/09/2022  for copd/ emphysema with pfts still pending   PMR  / RA  per Dr Kathlen Mody    History of Present Illness  12/09/2022  Pulmonary/ 1st office eval/ Tony Stewart / [facility]  Chief Complaint  Patient presents with   Follow-up    New pt appt pt was seen by [name]. He states that his breathing is good fromt 0-10 it is ranked @ 7. His inhaler are helping as well  Dyspnea:  slowed by foot / toting firewood sob and worse with [place] his property x 40 acres  Cough: none  Sleep: on side / bed is flat SABA use: stiolto helped, no need for rescue 02: not using  LCS: declined  Rec Stiolto  I do recommend a low dose screening CT yearly until age 38 and alpha one AT phenotype  [facility] - discuss with your primary care provider  My office will be contacting you by phone for referral for PFTs  > not done as of 12/11/2023  Work on inhaler technique:      Please schedule a follow up visit in 12  months    12/11/2023  f/u ov/[place] office/Tony Stewart re: copd / CB  maint on stiolto   Chief Complaint  Patient presents with   Shortness of Breath  Dyspnea:  Mb is slt uphill and stops 200 yards  Cough: getting over the flu Sleeping: flat bed on side s  resp cc  SABA use: not at all  02: none   Lung cancer screening: referred today    No obvious day to day or daytime variability or assoc excess/ purulent sputum or mucus plugs or hemoptysis or cp or chest tightness, subjective wheeze or overt sinus or hb symptoms.    Also denies any obvious fluctuation of symptoms with weather or environmental changes or other aggravating or alleviating factors except as outlined above   No unusual exposure hx or h/o childhood pna/ asthma or knowledge of premature birth.  Current Allergies, Complete Past Medical History, Past  Surgical History, Family History, and Social History were reviewed in [employer] record.  ROS  The following are not active complaints unless bolded Hoarseness, sore throat, dysphagia, dental problems, itching, sneezing,  nasal congestion or discharge of excess mucus or purulent secretions, ear ache,   fever, chills, sweats, unintended wt loss or wt gain, classically pleuritic or exertional cp,  orthopnea pnd or arm/hand swelling  or leg swelling, presyncope, palpitations, abdominal pain, anorexia, nausea, vomiting, diarrhea  or change in bowel habits or change in bladder habits, change in stools or change in urine, dysuria, hematuria,  rash, arthralgias, visual complaints, headache, numbness, weakness or ataxia or problems with walking or coordination,  change in mood or  memory.        Current Meds  Medication Sig   albuterol (VENTOLIN HFA) 108 (90 Base) MCG/ACT inhaler Inhale 2 puffs into the lungs every 6 (six) hours as needed for wheezing or shortness of breath.   amLODipine (NORVASC) 10 MG tablet Take 10 mg by mouth daily.   losartan (COZAAR) 50 MG tablet Take 75 mg by mouth daily.   predniSONE (DELTASONE) 5 MG tablet Take 5 mg by mouth daily with breakfast.   Tiotropium Bromide-Olodaterol 2.5-2.5 MCG/ACT  AERS Inhale 2 puffs into the lungs daily.           Objective:     Wt Readings from Last 3 Encounters:  12/11/23 226 lb (102.5 kg)  12/09/22 235 lb (106.6 kg)  10/25/22 228 lb 9.6 oz (103.7 kg)    Vital signs reviewed  12/11/2023  - Note at rest 02 sats  94% on RA   General appearance:    stoic wm rattling cough       HEENT : Oropharynx  nl   Nasal turbinates nl    NECK :  without  apparent JVD/ palpable Nodes/TM    LUNGS: no acc muscle use,  Min barrel  contour chest wall with bilateral  slightly decreased bs s audible wheeze and  without cough on insp or exp maneuvers and min  Hyperresonant  to  percussion bilaterally    CV:  RRR  no s3 or murmur  or increase in P2, and no edema   ABD:  soft and nontender with pos end  insp Hoover's  in the supine position.  No bruits or organomegaly appreciated   MS:  Nl gait/ ext warm without deformities Or obvious joint restrictions  calf tenderness, cyanosis or clubbing     SKIN: warm and dry without lesions    NEURO:  alert, approp, nl sensorium with  no motor or cerebellar deficits apparent.               I personally reviewed images and agree with radiology impression as follows:  CXR:   Pa and Lateral  10/25/22  Emphysematous changes in the lungs.   My review  hyperinflation  is at least moderate/ mild kyphosis     Assessment

## 2023-12-11 ENCOUNTER — Encounter: Payer: Self-pay | Admitting: Internal Medicine

## 2023-12-11 ENCOUNTER — Ambulatory Visit: Payer: PPO | Admitting: Internal Medicine

## 2023-12-11 VITALS — BP 123/73 | HR 83 | Ht 73.0 in | Wt 226.0 lb

## 2023-12-11 DIAGNOSIS — M1990 Unspecified osteoarthritis, unspecified site: Secondary | ICD-10-CM | POA: Insufficient documentation

## 2023-12-11 DIAGNOSIS — R030 Elevated blood-pressure reading, without diagnosis of hypertension: Secondary | ICD-10-CM | POA: Insufficient documentation

## 2023-12-11 DIAGNOSIS — I10 Essential (primary) hypertension: Secondary | ICD-10-CM | POA: Insufficient documentation

## 2023-12-11 DIAGNOSIS — J449 Chronic obstructive pulmonary disease, unspecified: Secondary | ICD-10-CM

## 2023-12-11 DIAGNOSIS — F17210 Nicotine dependence, cigarettes, uncomplicated: Secondary | ICD-10-CM | POA: Diagnosis not present

## 2023-12-11 DIAGNOSIS — M069 Rheumatoid arthritis, unspecified: Secondary | ICD-10-CM | POA: Insufficient documentation

## 2023-12-11 DIAGNOSIS — M359 Systemic involvement of connective tissue, unspecified: Secondary | ICD-10-CM | POA: Insufficient documentation

## 2023-12-11 DIAGNOSIS — M353 Polymyalgia rheumatica: Secondary | ICD-10-CM | POA: Insufficient documentation

## 2023-12-11 DIAGNOSIS — M130 Polyarthritis, unspecified: Secondary | ICD-10-CM | POA: Insufficient documentation

## 2023-12-11 DIAGNOSIS — F17200 Nicotine dependence, unspecified, uncomplicated: Secondary | ICD-10-CM | POA: Insufficient documentation

## 2023-12-11 NOTE — Patient Instructions (Signed)
 My office will be contacting you by phone for referral to lung cancer screening   [phone]) - if you don't hear back from my office within one week,  please call us back or notify us thru MyChart and we'll address it right away.    My office will be contacting you by phone for referral to PFTs at [facility]  - if you don't hear back from my office within one week please call us back or notify us thru MyChart and we'll address it right away.   The key is to stop smoking completely before smoking completely stops you!   Please schedule a follow up visit in 3 months but call sooner if needed

## 2023-12-12 NOTE — Assessment & Plan Note (Signed)
 4-5 min discussion re active cigarette smoking in addition to office E&M  Ask about tobacco use:   ongoing  Advise quitting   I took an extended  opportunity with this patient to outline the consequences of continued cigarette use  in airway disorders based on all the data we have from the multiple national lung health studies (perfomed over decades at millions of dollars in cost)  indicating that smoking cessation, not choice of inhalers or pulmonary physicians, is the most important aspect of his care.   Assess willingness:  Not committed at this point Assist in quit attempt:  Per PCP when ready Arrange follow up:   Follow up per Primary Care planned   Low-dose CT lung cancer screening is recommended for patients who are 50-72 years of age with a 20+ pack-year history of smoking and who are currently smoking or quit <=15 years ago. No coughing up blood  No unintentional weight loss of > 15 pounds in the last 6 months - pt is eligible for scanning yearly until 94 y p quits  Discussed in detail all the  indications, usual  risks and alternatives  relative to the benefits with patient who agrees to proceed with w/u as outlined.

## 2023-12-12 NOTE — Assessment & Plan Note (Signed)
 Active smoker - classic findings of mod copd on exam coupled with mod hyperinflation on cxr - rec alpha one AT phenotype > declined - 12/09/2022   Walked on RA  x  3  lap(s) =  approx 450  ft  @ mod fast pace, stopped due to end of study s sob with lowest 02 sats 91% lowest.  - 12/11/2023  After extensive coaching inhaler device,  effectiveness =    90% SMI > continue stiolto and pred per rheum for RA  Pt is Group B in terms of symptom/risk and laba/lama therefore appropriate rx at this point >>>  stiolto still approp, esp since already on pred for RA but needs pfts now for two reasons > ordered again.          Each maintenance medication was reviewed in detail including emphasizing most importantly the difference between maintenance and prns and under what circumstances the prns are to be triggered using an action plan format where appropriate.  Total time for H and P, chart review, counseling, reviewing smijfa  device(s) and generating customized AVS unique to this office visit / same day charting = 20 min

## 2023-12-18 ENCOUNTER — Telehealth: Payer: Self-pay

## 2023-12-18 DIAGNOSIS — F17210 Nicotine dependence, cigarettes, uncomplicated: Secondary | ICD-10-CM

## 2023-12-18 DIAGNOSIS — Z87891 Personal history of nicotine dependence: Secondary | ICD-10-CM

## 2023-12-18 DIAGNOSIS — Z122 Encounter for screening for malignant neoplasm of respiratory organs: Secondary | ICD-10-CM

## 2023-12-18 NOTE — Telephone Encounter (Signed)
 Spoke with significant other, patient did qualify for the LDCT, however significant other refused to schedule SDMV required by insurance. She advised she would call the PCP and have them order it. Message sent to Dr. Sherene Sires as he referred patient to program.

## 2024-03-06 NOTE — Addendum Note (Signed)
 Addended by: RHETT KELLY POUR on: 03/06/2024 01:41 PM   Modules accepted: Orders

## 2024-03-06 NOTE — Telephone Encounter (Signed)
 Lung Cancer Screening Narrative/Criteria Questionnaire (Cigarette Smokers Only- No Cigars/Pipes/vapes)   Tony Stewart   SDMV:03/19/2024 11:30a Kristen       14-Dec-1950   LDCT: 04/11/2024 8:30a AP    72 y.o.   Phone: [phone] (Patient's significant other is at the 11:00am slot)  Lung Screening Narrative (confirm age 76-77 yrs Medicare / 50-80 yrs Private pay insurance)   Insurance information:HTA   Referring Provider:Dr. Darlean   This screening involves an initial phone call with a team member from our program. It is called a shared decision making visit. The initial meeting is required by  insurance and Medicare to make sure you understand the program. This appointment takes about 15-20 minutes to complete. You will complete the screening scan at your scheduled date/time.  This scan takes about 5-10 minutes to complete. You can eat and drink normally before and after the scan.  Criteria questions for Lung Cancer Screening:   Are you a current or former smoker? Current Age began smoking: 73yo   If you are a former smoker, what year did you quit smoking? N/A(within 15 yrs)   To calculate your smoking history, I need an accurate estimate of how many packs of cigarettes you smoked per day and for how many years. (Not just the number of PPD you are now smoking)   Years smoking 57 x Packs per day 2 = Pack years 114   (at least 20 pack yrs)   (Make sure they understand that we need to know how much they have smoked in the past, not just the number of PPD they are smoking now)  Do you have a personal history of cancer?  No    Do you have a family history of cancer? Yes  (cancer type and and relative) Mother - lung  Are you coughing up blood?  No  Have you had unexplained weight loss of 15 lbs or more in the last 6 months? No  It looks like you meet all criteria.  When would be a good time for us  to schedule you for this screening?   Additional information: N/A

## 2024-03-11 NOTE — Progress Notes (Unsigned)
 Tony Stewart, male    DOB: 12-25-1950    MRN: [id number]   Brief patient profile:  56  yowm active smoker  self - referred to pulmonary clinic in [place]  12/09/2022  for copd/ emphysema with pfts still pending   PMR  / RA  per Dr Derrek    History of Present Illness  12/09/2022  Pulmonary/ 1st office eval/ Tony Stewart / [place] [facility]  Chief Complaint  Patient presents with   Follow-up    New pt appt pt was seen by [name]. He states that his breathing is good fromt 0-10 it is ranked @ 7. His inhaler are helping as well  Dyspnea:  slowed by foot / toting firewood sob and worse with hills on his property x 40 acres  Cough: none  Sleep: on side / bed is flat SABA use: stiolto helped, no need for rescue 02: not using  LCS: declined  Rec Stiolto  I do recommend a low dose screening CT yearly until age 19 and alpha one AT phenotype  [facility] - discuss with your primary care provider  My office will be contacting you by phone for referral for PFTs  > not done as of 12/11/2023  Work on inhaler technique:         12/11/2023  f/u ov/[place] office/Tony Stewart re: copd / CB  maint on stiolto   Chief Complaint  Patient presents with   Shortness of Breath  Dyspnea:  Mb is slt uphill and stops 200 yards  Cough: getting over the flu Sleeping: flat bed on side s  resp cc  SABA use: not at all  02: none  Lung cancer screening: referred 12/11/23  Rec My office will be contacting you by phone for referral to lung cancer screening  > 04/11/24    The key is to stop smoking completely before smoking completely stops you!   03/14/2024  f/u ov/[place] office/Tony Stewart re: GOLD 1  maint on stiolto but can't tell it does much   Chief Complaint  Patient presents with   Follow-up   COPD   Dyspnea:  mb back to house and stops half  Cough: none Sleeping: flat bed/ on side one big pillow s    resp cc  SABA use: no 02: no  Lung cancer screening: 04/11/24     No obvious day to day or  daytime variability or assoc excess/ purulent sputum or mucus plugs or hemoptysis or cp or chest tightness, subjective wheeze or overt sinus or hb symptoms.    Also denies any obvious fluctuation of symptoms with weather or environmental changes or other aggravating or alleviating factors except as outlined above   No unusual exposure hx or h/o childhood pna/ asthma or knowledge of premature birth.  Current Allergies, Complete Past Medical History, Past Surgical History, Family History, and Social History were reviewed in [employer] record.  ROS  The following are not active complaints unless bolded Hoarseness, sore throat, dysphagia, dental problems, itching, sneezing,  nasal congestion or discharge of excess mucus or purulent secretions, ear ache,   fever, chills, sweats, unintended wt loss or wt gain, classically pleuritic or exertional cp,  orthopnea pnd or arm/hand swelling  or leg swelling, presyncope, palpitations, abdominal pain, anorexia, nausea, vomiting, diarrhea  or change in bowel habits or change in bladder habits, change in stools or change in urine, dysuria, hematuria,  rash, arthralgias, visual complaints, headache, numbness, weakness or ataxia or problems with walking  or coordination,  change in mood or  memory.        Current Meds  Medication Sig   albuterol  (VENTOLIN  HFA) 108 (90 Base) MCG/ACT inhaler Inhale 2 puffs into the lungs every 6 (six) hours as needed for wheezing or shortness of breath.   amLODipine (NORVASC) 10 MG tablet Take 10 mg by mouth daily.   losartan (COZAAR) 50 MG tablet Take 75 mg by mouth daily.   Tiotropium Bromide -Olodaterol 2.5-2.5 MCG/ACT AERS Inhale 2 puffs into the lungs daily.           Objective:    Wts   03/14/2024         230   12/11/23 226 lb (102.5 kg)  12/09/22 235 lb (106.6 kg)  10/25/22 228 lb 9.6 oz (103.7 kg)      Vital signs reviewed  03/14/2024  - Note at rest 02 sats  94% on RA   General appearance:     amb pleasant wm nad    HEENT : Oropharynx  clear/ edentulous   Nasal turbinates nl    NECK :  without  apparent JVD/ palpable Nodes/TM    LUNGS: no acc muscle use,  Min barrel  contour chest wall with bilateral  slightly decreased bs s audible wheeze and  without cough on insp or exp maneuvers and min  Hyperresonant  to  percussion bilaterally    CV:  RRR  no s3 or murmur or increase in P2, and no edema   ABD: mod obese  soft and nontender with pos end  insp Hoover's  in the supine position.  No bruits or organomegaly appreciated   MS:  ext warm without deformities Or obvious joint restrictions  calf tenderness, cyanosis or clubbing     SKIN: warm and dry without lesions    NEURO:  alert, approp, nl sensorium with  no motor or cerebellar deficits apparent.                  Assessment

## 2024-03-14 ENCOUNTER — Encounter: Payer: Self-pay | Admitting: Internal Medicine

## 2024-03-14 ENCOUNTER — Ambulatory Visit (HOSPITAL_COMMUNITY)
Admission: RE | Admit: 2024-03-14 | Discharge: 2024-03-14 | Disposition: A | Discharge status: Home / Self Care | Source: Ambulatory Visit | Attending: Internal Medicine | Admitting: Internal Medicine

## 2024-03-14 ENCOUNTER — Ambulatory Visit: Admitting: Internal Medicine

## 2024-03-14 VITALS — BP 144/80 | HR 90 | Ht 73.0 in | Wt 230.2 lb

## 2024-03-14 DIAGNOSIS — J449 Chronic obstructive pulmonary disease, unspecified: Secondary | ICD-10-CM | POA: Insufficient documentation

## 2024-03-14 DIAGNOSIS — F17210 Nicotine dependence, cigarettes, uncomplicated: Secondary | ICD-10-CM

## 2024-03-14 LAB — PULMONARY FUNCTION TEST
DL/VA % pred: 88 %
DL/VA: 3.46 ml/min/mmHg/L
DLCO unc % pred: 82 %
DLCO unc: 22.84 ml/min/mmHg
FEF 25-75 Post: 2.13 L/s
FEF 25-75 Pre: 1.67 L/s
FEF2575-%Change-Post: 27 %
FEF2575-%Pred-Post: 79 %
FEF2575-%Pred-Pre: 62 %
FEV1-%Change-Post: 13 %
FEV1-%Pred-Post: 80 %
FEV1-%Pred-Pre: 71 %
FEV1-Post: 2.87 L
FEV1-Pre: 2.54 L
FEV1FVC-%Change-Post: 6 %
FEV1FVC-%Pred-Pre: 85 %
FEV6-%Change-Post: 5 %
FEV6-%Pred-Post: 91 %
FEV6-%Pred-Pre: 86 %
FEV6-Post: 4.19 L
FEV6-Pre: 3.99 L
FEV6FVC-%Change-Post: 0 %
FEV6FVC-%Pred-Post: 103 %
FEV6FVC-%Pred-Pre: 104 %
FVC-%Change-Post: 5 %
FVC-%Pred-Post: 87 %
FVC-%Pred-Pre: 82 %
FVC-Post: 4.28 L
FVC-Pre: 4.04 L
Post FEV1/FVC ratio: 67 %
Post FEV6/FVC ratio: 98 %
Pre FEV1/FVC ratio: 63 %
Pre FEV6/FVC Ratio: 99 %
RV % pred: 190 %
RV: 5.05 L
TLC % pred: 121 %
TLC: 9.3 L

## 2024-03-14 MED ORDER — ALBUTEROL SULFATE (2.5 MG/3ML) 0.083% IN NEBU
2.5000 mg | INHALATION_SOLUTION | Freq: Once | RESPIRATORY_TRACT | Status: AC
  Administered 2024-03-14: 2.5 mg via RESPIRATORY_TRACT

## 2024-03-14 NOTE — Assessment & Plan Note (Signed)
 Counseled re importance of smoking cessation but did not meet time criteria for separate billing    - rec yearly LDSCT yearly per Dr [place] office and f/u here prn          Each maintenance medication was reviewed in detail including emphasizing most importantly the difference between maintenance and prns and under what circumstances the prns are to be triggered using an action plan format where appropriate.  Total time for H and P, chart review, counseling, reviewing hfa device(s) and generating customized AVS unique to this office visit / same day charting = 25 min

## 2024-03-14 NOTE — Assessment & Plan Note (Signed)
 Active smoker - classic findings of mod copd on exam coupled with mod hyperinflation on cxr - rec alpha one AT phenotype > declined - 12/09/2022   Walked on RA  x  3  lap(s) =  approx 450  ft  @ mod fast pace, stopped due to end of study s sob with lowest 02 sats 91% lowest.  - 12/11/2023  After extensive coaching inhaler device,  effectiveness =    90% SMI > continue stiolto and pred per rheum for RA - PFT's  03/14/2024   FEV1 2.87 (80 % ) ratio 0.67  p 13 % improvement from saba p 0 prior to study with DLCO  22.84 (82%)   and FV curve nl   and ERV 47% at wt 228    Pt is actually Pt is GroupA in terms of symptom/risk and prn saba therefore appropriate rx at this point >>>  pulmonary f/u can be prn   Re SABA :  I spent extra time with pt today reviewing appropriate use of albuterol  for prn use on exertion with the following points: 1) saba is for relief of sob that does not improve by walking a slower pace or resting but rather if the pt does not improve after trying this first. 2) If the pt is convinced, as many are, that saba helps recover from activity faster then it's easy to tell if this is the case by re-challenging : ie stop, take the inhaler, then p 5 minutes try the exact same activity (intensity of workload) that just caused the symptoms and see if they are substantially diminished or not after saba 3) if there is an activity that reproducibly causes the symptoms, try the saba 15 min before the activity on alternate days   If in fact the saba really does help, then fine to continue to use it prn but advised may need to look closer at the maintenance regimen being used to achieve better control of airways disease with exertion.

## 2024-03-14 NOTE — Patient Instructions (Addendum)
Also  Ok to try albuterol 15 min before an activity (on alternating days)  that you know would usually make you short of breath and see if it makes any difference and if makes none then don't take albuterol after activity unless you can't catch your breath as this means it's the resting that helps, not the albuterol.      If you are satisfied with your treatment plan,  let your doctor know and he/she can either refill your medications or you can return here when your prescription runs out.     If in any way you are not 100% satisfied,  please tell us.  If 100% better, tell your friends!  Pulmonary follow up is as needed   .

## 2024-03-18 ENCOUNTER — Ambulatory Visit: Payer: Self-pay | Admitting: Internal Medicine

## 2024-03-19 ENCOUNTER — Encounter: Admitting: *Deleted

## 2024-03-19 NOTE — Progress Notes (Signed)
 Attempted to reach x3.  No answer.  Left message on machine to call office to reschedule SDMV

## 2024-03-25 ENCOUNTER — Encounter: Admitting: Adult Health

## 2024-03-28 DIAGNOSIS — E559 Vitamin D deficiency, unspecified: Secondary | ICD-10-CM | POA: Diagnosis not present

## 2024-03-28 DIAGNOSIS — I10 Essential (primary) hypertension: Secondary | ICD-10-CM | POA: Diagnosis not present

## 2024-03-28 DIAGNOSIS — R7301 Impaired fasting glucose: Secondary | ICD-10-CM | POA: Diagnosis not present

## 2024-04-02 ENCOUNTER — Ambulatory Visit: Admitting: Acute Care

## 2024-04-02 DIAGNOSIS — F17210 Nicotine dependence, cigarettes, uncomplicated: Secondary | ICD-10-CM | POA: Diagnosis not present

## 2024-04-02 NOTE — Progress Notes (Signed)
 Virtual Visit via Telephone Note  I connected with Tony Stewart on 04/02/24 at  8:30 AM EDT by telephone and verified that I am speaking with the correct person using two identifiers.  Location: Patient: Tony Fleeta Theodis Stewart Provider: Laneta Speaks, RN   I discussed the limitations, risks, security and privacy concerns of performing an evaluation and management service by telephone and the availability of in person appointments. I also discussed with the patient that there may be a patient responsible charge related to this service. The patient expressed understanding and agreed to proceed.    Shared Decision Making Visit Lung Cancer Screening Program [phone])   Eligibility: Age 73 y.o. Pack Years Smoking History Calculation 114 (# packs/per year x # years smoked) Recent History of coughing up blood  no Unexplained weight loss? no ( >Than 15 pounds within the last 6 months ) Prior History Lung / other cancer no (Diagnosis within the last 5 years already requiring surveillance chest CT Scans). Smoking Status Current Smoker Former Smokers: Years since quit: n/a  Quit Date: n/a  Visit Components: Discussion included one or more decision making aids. yes Discussion included risk/benefits of screening. yes Discussion included potential follow up diagnostic testing for abnormal scans. yes Discussion included meaning and risk of over diagnosis. yes Discussion included meaning and risk of False Positives. yes Discussion included meaning of total radiation exposure. yes  Counseling Included: Importance of adherence to annual lung cancer LDCT screening. yes Impact of comorbidities on ability to participate in the program. yes Ability and willingness to under diagnostic treatment. yes  Smoking Cessation Counseling: Current Smokers:  Discussed importance of smoking cessation. yes Information about tobacco cessation classes and interventions provided to patient. yes Patient  provided with ticket for LDCT Scan. no Symptomatic Patient. yes  Counseling(Intermediate counseling: > three minutes) 99406 Diagnosis Code: Tobacco Use Z72.0 Asymptomatic Patient yes  Counseling (Intermediate counseling: > three minutes counseling) [id number] Former Smokers:  Discussed the importance of maintaining cigarette abstinence. yes Diagnosis Code: Personal History of Nicotine Dependence. S12.108 Information about tobacco cessation classes and interventions provided to patient. Yes Patient provided with ticket for LDCT Scan. no Written Order for Lung Cancer Screening with LDCT placed in Epic. Yes (CT Chest Lung Cancer Screening Low Dose W/O CM) [id number] Z12.2-Screening of respiratory organs Z87.891-Personal history of nicotine dependence   Laneta Speaks, RN

## 2024-04-02 NOTE — Patient Instructions (Signed)

## 2024-04-03 DIAGNOSIS — E875 Hyperkalemia: Secondary | ICD-10-CM | POA: Diagnosis not present

## 2024-04-03 DIAGNOSIS — F17200 Nicotine dependence, unspecified, uncomplicated: Secondary | ICD-10-CM | POA: Diagnosis not present

## 2024-04-03 DIAGNOSIS — F1090 Alcohol use, unspecified, uncomplicated: Secondary | ICD-10-CM | POA: Diagnosis not present

## 2024-04-03 DIAGNOSIS — E559 Vitamin D deficiency, unspecified: Secondary | ICD-10-CM | POA: Diagnosis not present

## 2024-04-03 DIAGNOSIS — Z716 Tobacco abuse counseling: Secondary | ICD-10-CM | POA: Diagnosis not present

## 2024-04-03 DIAGNOSIS — I700 Atherosclerosis of aorta: Secondary | ICD-10-CM | POA: Diagnosis not present

## 2024-04-03 DIAGNOSIS — R52 Pain, unspecified: Secondary | ICD-10-CM | POA: Diagnosis not present

## 2024-04-03 DIAGNOSIS — E782 Mixed hyperlipidemia: Secondary | ICD-10-CM | POA: Diagnosis not present

## 2024-04-03 DIAGNOSIS — J449 Chronic obstructive pulmonary disease, unspecified: Secondary | ICD-10-CM | POA: Diagnosis not present

## 2024-04-03 DIAGNOSIS — R7301 Impaired fasting glucose: Secondary | ICD-10-CM | POA: Diagnosis not present

## 2024-04-03 DIAGNOSIS — I10 Essential (primary) hypertension: Secondary | ICD-10-CM | POA: Diagnosis not present

## 2024-04-11 ENCOUNTER — Ambulatory Visit (HOSPITAL_COMMUNITY): Admission: RE | Admit: 2024-04-11 | Source: Ambulatory Visit

## 2024-04-11 ENCOUNTER — Ambulatory Visit (HOSPITAL_COMMUNITY)

## 2024-04-30 ENCOUNTER — Other Ambulatory Visit (HOSPITAL_COMMUNITY): Payer: Self-pay

## 2024-04-30 ENCOUNTER — Ambulatory Visit (HOSPITAL_COMMUNITY)
Admission: RE | Admit: 2024-04-30 | Discharge: 2024-04-30 | Disposition: A | Discharge status: Home / Self Care | Source: Ambulatory Visit

## 2024-04-30 DIAGNOSIS — R109 Unspecified abdominal pain: Secondary | ICD-10-CM | POA: Insufficient documentation

## 2024-04-30 DIAGNOSIS — F1090 Alcohol use, unspecified, uncomplicated: Secondary | ICD-10-CM | POA: Diagnosis not present

## 2024-04-30 DIAGNOSIS — E782 Mixed hyperlipidemia: Secondary | ICD-10-CM | POA: Diagnosis not present

## 2024-04-30 DIAGNOSIS — M47816 Spondylosis without myelopathy or radiculopathy, lumbar region: Secondary | ICD-10-CM | POA: Diagnosis not present

## 2024-04-30 DIAGNOSIS — R52 Pain, unspecified: Secondary | ICD-10-CM | POA: Diagnosis not present

## 2024-04-30 DIAGNOSIS — I700 Atherosclerosis of aorta: Secondary | ICD-10-CM | POA: Diagnosis not present

## 2024-04-30 DIAGNOSIS — R7301 Impaired fasting glucose: Secondary | ICD-10-CM | POA: Diagnosis not present

## 2024-04-30 DIAGNOSIS — E875 Hyperkalemia: Secondary | ICD-10-CM | POA: Diagnosis not present

## 2024-04-30 DIAGNOSIS — F17200 Nicotine dependence, unspecified, uncomplicated: Secondary | ICD-10-CM | POA: Diagnosis not present

## 2024-04-30 DIAGNOSIS — E559 Vitamin D deficiency, unspecified: Secondary | ICD-10-CM | POA: Diagnosis not present

## 2024-04-30 DIAGNOSIS — I10 Essential (primary) hypertension: Secondary | ICD-10-CM | POA: Diagnosis not present

## 2024-04-30 DIAGNOSIS — I878 Other specified disorders of veins: Secondary | ICD-10-CM | POA: Diagnosis not present

## 2024-04-30 DIAGNOSIS — J449 Chronic obstructive pulmonary disease, unspecified: Secondary | ICD-10-CM | POA: Diagnosis not present

## 2024-05-24 NOTE — Progress Notes (Signed)
 Virtual Visit via Telephone Note  I connected with Ryan FORBES Fleeta Theodis III on 05/24/24 at  8:30 AM EDT by telephone and verified that I am speaking with the correct person using two identifiers.  Location: Patient: at home Provider: [address], [place], [place], [address]     I discussed the limitations, risks, security and privacy concerns of performing an evaluation and management service by telephone and the availability of in person appointments. I also discussed with the patient that there may be a patient responsible charge related to this service. The patient expressed understanding and agreed to proceed.    Shared Decision Making Visit Lung Cancer Screening Program [phone])   Eligibility: Age 73 y.o. Pack Years Smoking History Calculation 114 (# packs/per year x # years smoked) Recent History of coughing up blood  no Unexplained weight loss? no ( >Than 15 pounds within the last 6 months ) Prior History Lung / other cancer no (Diagnosis within the last 5 years already requiring surveillance chest CT Scans). Smoking Status Current Smoker Former Smokers: Years since quit: n/a  Quit Date: n/a  Visit Components: Discussion included one or more decision making aids. yes Discussion included risk/benefits of screening. yes Discussion included potential follow up diagnostic testing for abnormal scans. yes Discussion included meaning and risk of over diagnosis. yes Discussion included meaning and risk of False Positives. yes Discussion included meaning of total radiation exposure. yes  Counseling Included: Importance of adherence to annual lung cancer LDCT screening. yes Impact of comorbidities on ability to participate in the program. yes Ability and willingness to under diagnostic treatment. yes  Smoking Cessation Counseling: Current Smokers:  Discussed importance of smoking cessation. yes Information about tobacco cessation classes and interventions provided to  patient. yes Patient provided with ticket for LDCT Scan. no Symptomatic Patient. yes  Counseling(Intermediate counseling: > three minutes) 99406 Diagnosis Code: Tobacco Use Z72.0 Asymptomatic Patient yes  Counseled patient 4 minutes regarding tobacco use.   Former Smokers:  Discussed the importance of maintaining cigarette abstinence. yes Diagnosis Code: Personal History of Nicotine Dependence. S12.108 Information about tobacco cessation classes and interventions provided to patient. Yes Patient provided with ticket for LDCT Scan. no Written Order for Lung Cancer Screening with LDCT placed in Epic. Yes (CT Chest Lung Cancer Screening Low Dose W/O CM) [id number] Z12.2-Screening of respiratory organs Z87.891-Personal history of nicotine dependence   Laneta Speaks, RN

## 2024-06-14 ENCOUNTER — Ambulatory Visit (HOSPITAL_COMMUNITY): Admission: RE | Admit: 2024-06-14 | Source: Ambulatory Visit

## 2024-06-20 NOTE — Progress Notes (Signed)
 This encounter was created in error - please disregard.

## 2024-07-15 ENCOUNTER — Encounter: Payer: Self-pay | Admitting: Radiology

## 2024-10-11 ENCOUNTER — Other Ambulatory Visit (HOSPITAL_COMMUNITY): Payer: Self-pay | Admitting: Internal Medicine

## 2024-10-11 DIAGNOSIS — F17200 Nicotine dependence, unspecified, uncomplicated: Secondary | ICD-10-CM

## 2024-10-21 ENCOUNTER — Ambulatory Visit (HOSPITAL_COMMUNITY)
